# Patient Record
Sex: Male | Born: 1935
Health system: Southern US, Community
[De-identification: ages and names within clinical notes are randomized; demographics above are authoritative.]

## PROBLEM LIST (undated history)

## (undated) DIAGNOSIS — J45909 Unspecified asthma, uncomplicated: Secondary | ICD-10-CM

## (undated) DIAGNOSIS — J189 Pneumonia, unspecified organism: Secondary | ICD-10-CM

## (undated) DIAGNOSIS — M1990 Unspecified osteoarthritis, unspecified site: Secondary | ICD-10-CM

## (undated) DIAGNOSIS — I2510 Atherosclerotic heart disease of native coronary artery without angina pectoris: Secondary | ICD-10-CM

## (undated) DIAGNOSIS — N189 Chronic kidney disease, unspecified: Secondary | ICD-10-CM

## (undated) DIAGNOSIS — H548 Legal blindness, as defined in USA: Secondary | ICD-10-CM

## (undated) DIAGNOSIS — C801 Malignant (primary) neoplasm, unspecified: Secondary | ICD-10-CM

## (undated) DIAGNOSIS — R0602 Shortness of breath: Secondary | ICD-10-CM

## (undated) DIAGNOSIS — I10 Essential (primary) hypertension: Secondary | ICD-10-CM

## (undated) DIAGNOSIS — I219 Acute myocardial infarction, unspecified: Secondary | ICD-10-CM

## (undated) HISTORY — PX: HERNIA REPAIR: SHX51

## (undated) HISTORY — PX: EYE SURGERY: SHX253

## (undated) HISTORY — PX: TONSILLECTOMY: SUR1361

## (undated) HISTORY — PX: CORONARY ANGIOPLASTY: SHX604

## (undated) HISTORY — PX: KNEE ARTHROSCOPY: SUR90

---

## 2005-05-12 ENCOUNTER — Ambulatory Visit: Payer: Self-pay | Admitting: Unknown Physician Specialty

## 2005-05-12 ENCOUNTER — Other Ambulatory Visit: Payer: Self-pay

## 2005-05-17 ENCOUNTER — Ambulatory Visit: Payer: Self-pay | Admitting: General Surgery

## 2005-05-19 ENCOUNTER — Ambulatory Visit: Payer: Self-pay | Admitting: Unknown Physician Specialty

## 2005-06-05 ENCOUNTER — Emergency Department: Payer: Self-pay | Admitting: Emergency Medicine

## 2005-06-06 ENCOUNTER — Inpatient Hospital Stay: Payer: Self-pay | Admitting: Unknown Physician Specialty

## 2005-06-17 ENCOUNTER — Ambulatory Visit: Payer: Self-pay | Admitting: Unknown Physician Specialty

## 2006-08-29 ENCOUNTER — Ambulatory Visit: Payer: Self-pay | Admitting: Cardiovascular Disease

## 2008-06-26 ENCOUNTER — Ambulatory Visit: Payer: Self-pay | Admitting: Cardiovascular Disease

## 2008-12-11 ENCOUNTER — Ambulatory Visit: Payer: Self-pay | Admitting: Cardiovascular Disease

## 2010-04-14 ENCOUNTER — Ambulatory Visit: Payer: Self-pay | Admitting: Gastroenterology

## 2010-05-24 ENCOUNTER — Ambulatory Visit: Payer: Self-pay | Admitting: Gastroenterology

## 2010-05-26 LAB — PATHOLOGY REPORT

## 2010-06-16 ENCOUNTER — Ambulatory Visit: Payer: Self-pay | Admitting: Gastroenterology

## 2010-07-01 DIAGNOSIS — C4492 Squamous cell carcinoma of skin, unspecified: Secondary | ICD-10-CM

## 2010-07-01 HISTORY — DX: Squamous cell carcinoma of skin, unspecified: C44.92

## 2010-07-30 ENCOUNTER — Other Ambulatory Visit: Payer: Self-pay | Admitting: Gastroenterology

## 2011-08-06 ENCOUNTER — Other Ambulatory Visit: Payer: Self-pay | Admitting: Orthopedic Surgery

## 2011-11-07 ENCOUNTER — Inpatient Hospital Stay: Admit: 2011-11-07 | Payer: Self-pay | Admitting: Orthopedic Surgery

## 2011-11-07 SURGERY — ARTHROPLASTY, KNEE, TOTAL
Anesthesia: Choice | Site: Knee | Laterality: Left

## 2011-11-13 ENCOUNTER — Other Ambulatory Visit: Payer: Self-pay | Admitting: Orthopedic Surgery

## 2011-11-13 MED ORDER — DEXAMETHASONE SODIUM PHOSPHATE 10 MG/ML IJ SOLN: 10.0000 mg | Freq: Once | INTRAMUSCULAR | Status: DC

## 2011-11-13 MED ORDER — BUPIVACAINE 0.25 % ON-Q PUMP SINGLE CATH 300ML: 300.0000 mL | INJECTION | Status: DC

## 2012-02-15 ENCOUNTER — Encounter (HOSPITAL_COMMUNITY): Payer: Self-pay | Admitting: Pharmacy Technician

## 2012-02-20 NOTE — Patient Instructions (Signed)
20 Paul Gaines  02/20/2012   Your procedure is scheduled on:  02/27/12 1115am-1220pm  Report to [facility] at 0845 AM.  Call this number if you have problems the morning of surgery: [phone]   Remember:   Do not eat food:After Midnight.  May have clear liquids:until Midnight .  Marland Kitchen  Take these medicines the morning of surgery with A SIP OF WATER:    Do not wear jewelry  Do not wear lotions, powders, or perfumes. .   Men may shave face and neck.  Do not bring valuables to the hospital.  Contacts, dentures or bridgework may not be worn into surgery.  Leave suitcase in the car. After surgery it may be brought to your room.  For patients admitted to the hospital, checkout time is 11:00 AM the day of discharge.    Special Instructions: CHG Shower Use Special Wash: 1/2 bottle night before surgery and 1/2 bottle morning of surgery. shower chin to toes with CHG.  Wash face and private parts with regular soap.    Please read over the following fact sheets that you were given: MRSA Information, coughing and deep breathing exercises, leg exercises, Blood Transfusion Fact Sheet , Incentive spirometry fact Sheet

## 2012-02-21 ENCOUNTER — Encounter (HOSPITAL_COMMUNITY)
Admission: RE | Admit: 2012-02-21 | Discharge: 2012-02-21 | Disposition: A | Discharge status: Home / Self Care | Payer: Medicare Other | Source: Ambulatory Visit | Attending: Orthopedic Surgery | Admitting: Orthopedic Surgery

## 2012-02-21 ENCOUNTER — Ambulatory Visit (HOSPITAL_COMMUNITY)
Admission: RE | Admit: 2012-02-21 | Discharge: 2012-02-21 | Disposition: A | Discharge status: Home / Self Care | Payer: Medicare Other | Source: Ambulatory Visit | Attending: Orthopedic Surgery | Admitting: Orthopedic Surgery

## 2012-02-21 ENCOUNTER — Encounter (HOSPITAL_COMMUNITY): Payer: Self-pay

## 2012-02-21 DIAGNOSIS — Z01818 Encounter for other preprocedural examination: Secondary | ICD-10-CM | POA: Insufficient documentation

## 2012-02-21 DIAGNOSIS — Z01812 Encounter for preprocedural laboratory examination: Secondary | ICD-10-CM | POA: Insufficient documentation

## 2012-02-21 HISTORY — DX: Malignant (primary) neoplasm, unspecified: C80.1

## 2012-02-21 HISTORY — DX: Unspecified osteoarthritis, unspecified site: M19.90

## 2012-02-21 HISTORY — DX: Unspecified asthma, uncomplicated: J45.909

## 2012-02-21 HISTORY — DX: Atherosclerotic heart disease of native coronary artery without angina pectoris: I25.10

## 2012-02-21 HISTORY — DX: Shortness of breath: R06.02

## 2012-02-21 HISTORY — DX: Essential (primary) hypertension: I10

## 2012-02-21 HISTORY — DX: Pneumonia, unspecified organism: J18.9

## 2012-02-21 HISTORY — DX: Chronic kidney disease, unspecified: N18.9

## 2012-02-21 HISTORY — DX: Acute myocardial infarction, unspecified: I21.9

## 2012-02-21 LAB — COMPREHENSIVE METABOLIC PANEL
Albumin: 4.2 g/dL (ref 3.5–5.2)
BUN: 21 mg/dL (ref 6–23)
Calcium: 9.8 mg/dL (ref 8.4–10.5)
GFR calc Af Amer: 68 mL/min — ABNORMAL LOW (ref >90)
Glucose, Bld: 107 mg/dL — ABNORMAL HIGH (ref 70–99)
Sodium: 138 mEq/L (ref 135–145)
Total Protein: 7.5 g/dL (ref 6.0–8.3)

## 2012-02-21 LAB — CBC
Hemoglobin: 14.6 g/dL (ref 13.0–17.0)
MCH: 30.6 pg (ref 26.0–34.0)
MCHC: 33.9 g/dL (ref 30.0–36.0)
RDW: 14.1 % (ref 11.5–15.5)

## 2012-02-21 LAB — URINALYSIS, ROUTINE W REFLEX MICROSCOPIC
Leukocytes, UA: NEGATIVE
Nitrite: NEGATIVE
Protein, ur: NEGATIVE mg/dL
Urobilinogen, UA: 0.2 mg/dL (ref 0.0–1.0)

## 2012-02-21 LAB — SURGICAL PCR SCREEN: MRSA, PCR: NEGATIVE

## 2012-02-21 LAB — DIFFERENTIAL: Neutrophils Relative %: 58 % (ref 43–77)

## 2012-02-21 LAB — PROTIME-INR: Prothrombin Time: 14 seconds (ref 11.6–15.2)

## 2012-02-21 NOTE — Progress Notes (Signed)
Patient reports gum issue.  Nurse instructed patient to contact dentist and office of Dr Despina Hick prior to surgery.  Patient voiced understanding.

## 2012-02-21 NOTE — Progress Notes (Signed)
Your patient has screened at an elevated risk for Obstructive Sleep Apnea using the STOP-Bang Tool during a pre-surgical visit. A score of 4 or greater is an elevated risk.  

## 2012-02-21 NOTE — Progress Notes (Deleted)
The note regarding the score on the STOP - Bang Tool was sent to you in error.  I apologize.

## 2012-02-21 NOTE — Progress Notes (Signed)
02/21/12 1034  OBSTRUCTIVE SLEEP APNEA  Have you ever been diagnosed with sleep apnea through a sleep study? No  Do you snore loudly (loud enough to be heard through closed doors)?  0  Do you often feel tired, fatigued, or sleepy during the daytime? 1  Has anyone observed you stop breathing during your sleep? 0  Do you have, or are you being treated for high blood pressure? 1  BMI more than 35 kg/m2? 0  Age over 76 years old? 1  Neck circumference greater than 40 cm/18 inches? 0  Gender: 1  Obstructive Sleep Apnea Score 4   Score 4 or greater  Updated health history

## 2012-02-21 NOTE — Progress Notes (Signed)
Clearance on chart- Dr Evalee Mutton Clearance on chart- Dr Welton Flakes 12/21/10 EKG on chart  05/26/11 EKG on chart- baseline of stress test  05/26/11 STRESS TEST on chart  ECHO 10/27/11 on chart  01/24/12 Office visit with Dr Evalee Mutton on chart

## 2012-02-27 ENCOUNTER — Encounter (HOSPITAL_COMMUNITY): Admission: RE | Payer: Self-pay | Source: Ambulatory Visit

## 2012-02-27 ENCOUNTER — Inpatient Hospital Stay (HOSPITAL_COMMUNITY): Admission: RE | Admit: 2012-02-27 | Payer: Medicare Other | Source: Ambulatory Visit | Admitting: Orthopedic Surgery

## 2012-02-27 SURGERY — ARTHROPLASTY, KNEE, TOTAL
Anesthesia: Choice | Site: Knee | Laterality: Left

## 2012-03-13 ENCOUNTER — Other Ambulatory Visit: Payer: Self-pay | Admitting: Orthopedic Surgery

## 2012-03-13 MED ORDER — BUPIVACAINE 0.25 % ON-Q PUMP SINGLE CATH 300ML: 300.0000 mL | INJECTION | Status: DC

## 2012-03-13 MED ORDER — DEXAMETHASONE SODIUM PHOSPHATE 10 MG/ML IJ SOLN: 10.0000 mg | Freq: Once | INTRAMUSCULAR | Status: DC

## 2012-03-13 NOTE — Progress Notes (Signed)
Preoperative surgical orders have been place into the [facility] hospital system for Crist Infante on 03/13/2012, 4:52 PM  by Patrica Duel for surgery on 03/26/2012.  Preop Total Knee orders including Bupivacaine On-Q pump, IV Tylenol, and IV Decadron as long as there are no contraindications to the above medications. Avel Peace, PA-C

## 2012-03-16 ENCOUNTER — Encounter (HOSPITAL_COMMUNITY): Payer: Self-pay | Admitting: Pharmacy Technician

## 2012-03-21 ENCOUNTER — Other Ambulatory Visit: Payer: Self-pay | Admitting: Orthopedic Surgery

## 2012-03-21 ENCOUNTER — Encounter (HOSPITAL_COMMUNITY): Payer: Self-pay

## 2012-03-21 ENCOUNTER — Encounter (HOSPITAL_COMMUNITY)
Admission: RE | Admit: 2012-03-21 | Discharge: 2012-03-21 | Disposition: A | Discharge status: Home / Self Care | Payer: Medicare Other | Source: Ambulatory Visit | Attending: Orthopedic Surgery | Admitting: Orthopedic Surgery

## 2012-03-21 HISTORY — DX: Legal blindness, as defined in USA: H54.8

## 2012-03-21 LAB — URINALYSIS, ROUTINE W REFLEX MICROSCOPIC
Glucose, UA: NEGATIVE mg/dL
Ketones, ur: NEGATIVE mg/dL
Leukocytes, UA: NEGATIVE
Nitrite: NEGATIVE
Specific Gravity, Urine: 1.027 (ref 1.005–1.030)
pH: 6 (ref 5.0–8.0)

## 2012-03-21 LAB — CBC
Platelets: 165 10*3/uL (ref 150–400)
RBC: 4.36 MIL/uL (ref 4.22–5.81)
RDW: 14.2 % (ref 11.5–15.5)
WBC: 7.7 10*3/uL (ref 4.0–10.5)

## 2012-03-21 LAB — COMPREHENSIVE METABOLIC PANEL
ALT: 11 U/L (ref 0–53)
AST: 13 U/L (ref 0–37)
Albumin: 4.1 g/dL (ref 3.5–5.2)
CO2: 28 mEq/L (ref 19–32)
Calcium: 9.3 mg/dL (ref 8.4–10.5)
Chloride: 106 mEq/L (ref 96–112)
Creatinine, Ser: 1.19 mg/dL (ref 0.50–1.35)
GFR calc non Af Amer: 57 mL/min — ABNORMAL LOW (ref >90)
Sodium: 141 mEq/L (ref 135–145)
Total Bilirubin: 0.4 mg/dL (ref 0.3–1.2)

## 2012-03-21 LAB — PROTIME-INR: INR: 1.15 (ref 0.00–1.49)

## 2012-03-21 LAB — SURGICAL PCR SCREEN
MRSA, PCR: NEGATIVE
Staphylococcus aureus: NEGATIVE

## 2012-03-21 NOTE — Progress Notes (Signed)
11/2008 Cath note on chart  05/26/11 Stress test on chart  10/25/11 Last office visit note on chart with cardiology  01/24/12 Last office visit note in chart  4/11 /13 ECHO in chart

## 2012-03-21 NOTE — H&P (Signed)
Paul Gaines  DOB: 02-28-1933 Married / Language: English / Race: Refused to Report/Unreported Male  Date of Admission:  03/26/2012  Chief Complaint:  Left Knee Pain  History of Present Illness The patient is a 78 year old male who comes in for a preoperative History and Physical. The patient is scheduled for a left total knee arthroplasty to be performed by Dr. Gus Rankin. Aluisio, MD at [facility] on 03/26/2012. The patient is a 78 year old male who presents for follow up of their knee. The patient is being followed for their left knee pain and osteoarthritis. Symptoms reported today include: pain (with extended walking), pain at night and swelling (into lower leg and ankle). The following medication has been used for pain control: antiinflammatory medication (Naproxen). The patient indicates that they have questions or concerns today regarding whether or not to repeat Synvisc, not much relief with last injections. He's got advanced end stage arthritis of the left knee with progressively worsening pain and dysfunction. Injections have not helped. At this point he says he needs a more permanent solution. I would recommend knee replacement surgery. They have been treated conservatively in the past for the above stated problem and despite conservative measures, they continue to have progressive pain and severe functional limitations and dysfunction. They have failed non-operative management including home exercise, medications, and injections. It is felt that they would benefit from undergoing total joint replacement. Risks and benefits of the procedure have been discussed with the patient and they elect to proceed with surgery. There are no active contraindications to surgery such as ongoing infection or rapidly progressive neurological disease.   Allergies CELEBREX. 04/20/2006 Thinks it may have caused his heart attack VIOXX. 04/20/2006 Chest pain.   Past Medical History Coronary  artery disease High blood pressure Hypercholesterolemia Skin Cancer Hypercholesterolemia Myocardial infarction. Date: 29. Measles Stent. 2000 Coronary Artery Disease/Heart Disease Macular Degeneration Impaired Vision Hypertension COPD   Family History Mother. Dementia Father. Cancer.   Social History Alcohol use. Occasional alcohol use. 1 Beer per week current drinker; drinks wine; only occasionally per week Children. 8 5 or more Current work status. retired Financial planner (Currently). no Drug/Alcohol Rehab (Previously). no Exercise. Exercises weekly; does gym / weights Illicit drug use. no Living situation. Lives with spouse. live with spouse Marital status. married Number of flights of stairs before winded. greater than 5 Pain Contract. no Tobacco / smoke exposure. no Tobacco use. Former smoker. quit over 30 years ago former smoker Post-Surgical Plans. Plan is for home. Advance Directives. Living Will   Medication History Multiple Vitamin (1 Oral) Active. Aspirin (81MG  Tablet, 1 Oral) Active. Timolol Maleate (0.5% Solution, Ophthalmic) Active. PrednisoLONE Acetate (1% Suspension, Ophthalmic) Active. Pantoprazole Sodium (40MG  Tablet DR, Oral) Active. Pravastatin Sodium (40MG  Tablet, Oral) Active. Carvedilol (25MG  Tablet, Oral) Active. Doxazosin Mesylate (4MG  Tablet, Oral) Active. AmLODIPine Besylate (5MG  Tablet, Oral) Active. Probiotic ( Oral) Specific dose unknown - Active. Naproxen Sodium (500MG  Tablet ER, Oral) Active. (prn) Nitrostat (0.4MG  Tab Sublingual, Sublingual) Active. (prn)   Past Surgical History Colon Polyp Removal - Colonoscopy Heart Stents Tonsillectomy Total Knee Replacement. left Cardiac Pacemaker Insertion Hernia Repair Cardiac Cath with Stent. Date: 2000. Cardiac Cath with Ballon Angioplasty Root Canal   Review of Systems General:Not Present- Chills, Fever, Night Sweats, Appetite Loss,  Fatigue, Feeling sick, Weight Gain and Weight Loss. HEENT:Not Present- Sensitivity to light, Hearing problems, Nose Bleed and Ringing in the Ears. Neck:Not Present- Swollen Glands and Neck Mass. Respiratory:Not Present- Snoring, Chronic  Cough, Bloody sputum and Dyspnea. Cardiovascular:Not Present- Shortness of Breath, Chest Pain, Swelling of Extremities, Leg Cramps and Palpitations. Gastrointestinal:Not Present- Bloody Stool, Heartburn, Abdominal Pain, Vomiting, Nausea and Incontinence of Stool. Male Genitourinary:Not Present- Blood in Urine, Frequency, Incontinence and Nocturia. Musculoskeletal:Present- Joint Stiffness. Not Present- Muscle Weakness, Muscle Pain, Joint Swelling, Joint Pain and Back Pain. Neurological:Not Present- Tingling, Numbness, Burning, Tremor, Headaches and Dizziness. Endocrine:Not Present- Cold Intolerance, Heat Intolerance, Excessive hunger and Excessive Thirst. Hematology:Not Present- Abnormal Bleeding, Anemia, Blood Clots and Easy Bruising.   Vitals Weight: 207 lb Height: 71 in Body Surface Area: 2.17 m Body Mass Index: 28.87 kg/m Pulse: 56 (Regular) Resp.: 14 (Unlabored) BP: 110/60 (Sitting, Right Arm, Standard)    Physical Exam The physical exam findings are as follows:  Note: Patient is a 78 year old male with continued knee pain.   General Mental Status - Alert, cooperative and good historian. General Appearance- pleasant. Not in acute distress. Orientation- Oriented X3. Build & Nutrition- Well nourished and Well developed.   Head and Neck Head- normocephalic, atraumatic . Neck Global Assessment- supple. no bruit auscultated on the right and no bruit auscultated on the left.   Eye Pupil- Bilateral- Regular and Round. Motion- Bilateral- EOMI.   Note: wears glasses  Chest and Lung Exam Auscultation: Breath sounds:- clear at anterior chest wall and - clear at posterior chest wall. Adventitious  sounds:- No Adventitious sounds.   Cardiovascular Auscultation:Rhythm- Regular rate and rhythm. Heart Sounds- S1 WNL and S2 WNL. Murmurs & Other Heart Sounds:Auscultation of the heart reveals - No Murmurs.   Abdomen Palpation/Percussion:Tenderness- Abdomen is non-tender to palpation. Rigidity (guarding)- Abdomen is soft. Auscultation:Auscultation of the abdomen reveals - Bowel sounds normal.   Male Genitourinary Not done, not pertinent to present illness  Musculoskeletal On exam, he's a well-developed male, alert and oriented, in no apparent distress. His left knee shows no effusion. There is a varus deformity. ROM is 5-120. There is marked crepitus on ROM. He is tender medial greater than lateral with no instability. Right knee exam is unremarkable. He hasn't had x-rays in almost 2 1/2 years, so we obtained x-rays today.  RADIOGRAPHS: He has severe bone on bone arthritis, all 3 compartments, with subchondral sclerosis and osteophyte formation. Arthritis is worse in the medial compartment but present in all 3.  Assessment & Plan Osteoarthritis, knee (715.96) Impression: Left Knee  Patient is for a Left Total Knee Replacement by Dr. Lequita Halt.  ECHO EF 55-75% per exam on 10/27/11.  Plan is to go to rehab for a short stay after the surgery. He is looking into [facility]	[place] in [place], [place].  Signed electronically by Roberts Gaudy, PA-C

## 2012-03-21 NOTE — Patient Instructions (Addendum)
20 Beacher Every  03/21/2012   Your procedure is scheduled on:  03/26/12 0955am-1100am  Report to [facility] at 0730 AM.  Call this number if you have problems the morning of surgery: [phone]   Remember:   Do not eat food:After Midnight.  May have clear liquids:until Midnight .    Take these medicines the morning of surgery with A SIP OF WATER:    Do not wear jewelry, make-up or nail polish.  Do not wear lotions, powders, or perfumes.   . Men may shave face and neck.  Do not bring valuables to the hospital.  Contacts, dentures or bridgework may not be worn into surgery.  Leave suitcase in the car. After surgery it may be brought to your room.  For patients admitted to the hospital, checkout time is 11:00 AM the day of discharge.      Special Instructions: CHG Shower Use Special Wash: 1/2 bottle night before surgery and 1/2 bottle morning of surgery.   Please read over the following fact sheets that you were given: MRSA Information, Incentive Spirometry Fact sheet, Blood Transfusion FAct Sheet, coughing and deep breathing exercises, leg exercises

## 2012-03-21 NOTE — Progress Notes (Signed)
03/21/12 1428  OBSTRUCTIVE SLEEP APNEA  Have you ever been diagnosed with sleep apnea through a sleep study? No  Do you snore loudly (loud enough to be heard through closed doors)?  0  Do you often feel tired, fatigued, or sleepy during the daytime? 1  Has anyone observed you stop breathing during your sleep? 0  Do you have, or are you being treated for high blood pressure? 1  BMI more than 35 kg/m2? 0  Age over 76 years old? 1  Neck circumference greater than 40 cm/18 inches? 0  Gender: 1  Obstructive Sleep Apnea Score 4   Score 4 or greater  Updated health history

## 2012-03-22 NOTE — Progress Notes (Signed)
Patient had recent 2 root canals in 02/2012.

## 2012-03-25 ENCOUNTER — Other Ambulatory Visit: Payer: Self-pay | Admitting: Orthopedic Surgery

## 2012-03-25 NOTE — H&P (Signed)
Paul Gaines  DOB: 08/13/1932 Married / Language: English / Race: Refused to Report/Unreported Male  Date of Admission:  03/26/2012  Chief Complaint:  Left Knee Pain  History of Present Illness The patient is a 79 year old male who comes in for a preoperative History and Physical. The patient is scheduled for a left total knee arthroplasty to be performed by Dr. Frank V. Aluisio, MD at [facility] on 03/26/2012. The patient is a 78 year old male who presents for follow up of their knee. The patient is being followed for their left knee pain and osteoarthritis. Symptoms reported today include: pain (with extended walking), pain at night and swelling (into lower leg and ankle). The following medication has been used for pain control: antiinflammatory medication (Naproxen). The patient indicates that they have questions or concerns today regarding whether or not to repeat Synvisc, not much relief with last injections. He's got advanced end stage arthritis of the left knee with progressively worsening pain and dysfunction. Injections have not helped. At this point he says he needs a more permanent solution. I would recommend knee replacement surgery. They have been treated conservatively in the past for the above stated problem and despite conservative measures, they continue to have progressive pain and severe functional limitations and dysfunction. They have failed non-operative management including home exercise, medications, and injections. It is felt that they would benefit from undergoing total joint replacement. Risks and benefits of the procedure have been discussed with the patient and they elect to proceed with surgery. There are no active contraindications to surgery such as ongoing infection or rapidly progressive neurological disease.  Problem List/Past Medical Osteoarthritis, knee (715.96) Coronary artery disease High blood pressure Hypercholesterolemia Skin  Cancer Hypercholesterolemia Myocardial infarction. Date: 1988. Measles Stent. 2000 Coronary Artery Disease/Heart Disease Macular Degeneration Impaired Vision Hypertension COPD   Allergies CELEBREX. 04/20/2006 Thinks it may have caused his heart attack VIOXX. 04/20/2006 Chest pain.   Family History Mother. Dementia Father. Cancer.   Social History Alcohol use. Occasional alcohol use. 1 Beer per week current drinker; drinks wine; only occasionally per week Children. 8 5 or more Current work status. retired Drug/Alcohol Rehab (Currently). no Drug/Alcohol Rehab (Previously). no Exercise. Exercises weekly; does gym / weights Illicit drug use. no Living situation. Lives with spouse. live with spouse Marital status. married Number of flights of stairs before winded. greater than 5 Pain Contract. no Tobacco / smoke exposure. no Tobacco use. Former smoker. quit over 30 years ago former smoker Post-Surgical Plans. Plan is for home. Advance Directives. Living Will   Medication History Multiple Vitamin (1 Oral) Active. Aspirin (81MG Tablet, 1 Oral) Active. Timolol Maleate (0.5% Solution, Ophthalmic) Active. PrednisoLONE Acetate (1% Suspension, Ophthalmic) Active. Pantoprazole Sodium (40MG Tablet DR, Oral) Active. Pravastatin Sodium (40MG Tablet, Oral) Active. Carvedilol (25MG Tablet, Oral) Active. Doxazosin Mesylate (4MG Tablet, Oral) Active. AmLODIPine Besylate (5MG Tablet, Oral) Active. Probiotic ( Oral) Specific dose unknown - Active. Naproxen Sodium (500MG Tablet ER, Oral) Active. (prn) Nitrostat (0.4MG Tab Sublingual, Sublingual) Active. (prn)   Past Surgical History Colon Polyp Removal - Colonoscopy Heart Stents Tonsillectomy Total Knee Replacement. left Cardiac Pacemaker Insertion Hernia Repair Cardiac Cath with Stent. Date: 2000. Cardiac Cath with [place]on Angioplasty Root Canal   Review of Systems General:Not Present-  Chills, Fever, Night Sweats, Appetite Loss, Fatigue, Feeling sick, Weight Gain and Weight Loss. HEENT:Not Present- Sensitivity to light, Hearing problems, Nose Bleed and Ringing in the Ears. Neck:Not Present- Swollen Glands and Neck Mass. Respiratory:Not Present-   Snoring, Chronic Cough, Bloody sputum and Dyspnea. Cardiovascular:Not Present- Shortness of Breath, Chest Pain, Swelling of Extremities, Leg Cramps and Palpitations. Gastrointestinal:Not Present- Bloody Stool, Heartburn, Abdominal Pain, Vomiting, Nausea and Incontinence of Stool. Male Genitourinary:Not Present- Blood in Urine, Frequency, Incontinence and Nocturia. Musculoskeletal:Present- Joint Stiffness. Not Present- Muscle Weakness, Muscle Pain, Joint Swelling, Joint Pain and Back Pain. Neurological:Not Present- Tingling, Numbness, Burning, Tremor, Headaches and Dizziness. Endocrine:Not Present- Cold Intolerance, Heat Intolerance, Excessive hunger and Excessive Thirst. Hematology:Not Present- Abnormal Bleeding, Anemia, Blood Clots and Easy Bruising.   Vitals Weight: 207 lb Height: 71 in Body Surface Area: 2.17 m Body Mass Index: 28.87 kg/m Pulse: 56 (Regular) Resp.: 14 (Unlabored) BP: 110/60 (Sitting, Right Arm, Standard)    Physical Exam The physical exam findings are as follows:  Note: Patient is a 79 year old male with continued knee pain.   General Mental Status - Alert, cooperative and good historian. General Appearance- pleasant. Not in acute distress. Orientation- Oriented X3. Build & Nutrition- Well nourished and Well developed.   Head and Neck Head- normocephalic, atraumatic . Neck Global Assessment- supple. no bruit auscultated on the right and no bruit auscultated on the left.   Eye Pupil- Bilateral- Regular and Round. Motion- Bilateral- EOMI. wears glasses  Chest and Lung Exam Auscultation: Breath sounds:- clear at anterior chest wall and - clear at  posterior chest wall. Adventitious sounds:- No Adventitious sounds.   Cardiovascular Auscultation:Rhythm- Regular rate and rhythm. Heart Sounds- S1 WNL and S2 WNL. Murmurs & Other Heart Sounds:Auscultation of the heart reveals - No Murmurs.   Abdomen Palpation/Percussion:Tenderness- Abdomen is non-tender to palpation. Rigidity (guarding)- Abdomen is soft. Auscultation:Auscultation of the abdomen reveals - Bowel sounds normal.   Male Genitourinary Not done, not pertinent to present illness  Musculoskeletal  On exam, he's a well-developed male, alert and oriented, in no apparent distress. His left knee shows no effusion. There is a varus deformity. ROM is 5-120. There is marked crepitus on ROM. He is tender medial greater than lateral with no instability. Right knee exam is unremarkable. He hasn't had x-rays in almost 2 1/2 years, so we obtained x-rays today.  RADIOGRAPHS: He has severe bone on bone arthritis, all 3 compartments, with subchondral sclerosis and osteophyte formation. Arthritis is worse in the medial compartment but present in all 3.  Assessment & Plan Osteoarthritis, knee (715.96) Impression: Left Knee  Note: Patient is for a Left Total Knee Replacement by Dr. Aluisio.  PCP - Dr. Tejan-sie Cards - Dr. Khan  ECHO EF 55-75% per exam on 10/27/11.  Plan is to go to rehab for a short stay after the surgery. He is looking into [facility] in [place], [place].  Signed electronically by DREW L Teneshia Hedeen, PA-C 

## 2012-03-26 ENCOUNTER — Encounter (HOSPITAL_COMMUNITY): Payer: Self-pay | Admitting: *Deleted

## 2012-03-26 ENCOUNTER — Encounter (HOSPITAL_COMMUNITY): Payer: Self-pay | Admitting: Anesthesiology

## 2012-03-26 ENCOUNTER — Inpatient Hospital Stay (HOSPITAL_COMMUNITY): Payer: Medicare Other | Admitting: Anesthesiology

## 2012-03-26 ENCOUNTER — Encounter (HOSPITAL_COMMUNITY)
Admission: RE | Disposition: A | Discharge status: Skilled Nursing Facility | Payer: Self-pay | Source: Ambulatory Visit | Attending: Orthopedic Surgery

## 2012-03-26 ENCOUNTER — Inpatient Hospital Stay (HOSPITAL_COMMUNITY)
Admission: RE | Admit: 2012-03-26 | Discharge: 2012-03-30 | DRG: 470 | Disposition: A | Discharge status: Skilled Nursing Facility | Payer: Medicare Other | Source: Ambulatory Visit | Attending: Orthopedic Surgery | Admitting: Orthopedic Surgery

## 2012-03-26 DIAGNOSIS — I252 Old myocardial infarction: Secondary | ICD-10-CM

## 2012-03-26 DIAGNOSIS — I2510 Atherosclerotic heart disease of native coronary artery without angina pectoris: Secondary | ICD-10-CM | POA: Diagnosis present

## 2012-03-26 DIAGNOSIS — M1710 Unilateral primary osteoarthritis, unspecified knee: Principal | ICD-10-CM | POA: Diagnosis present

## 2012-03-26 DIAGNOSIS — D62 Acute posthemorrhagic anemia: Secondary | ICD-10-CM | POA: Diagnosis not present

## 2012-03-26 DIAGNOSIS — M179 Osteoarthritis of knee, unspecified: Secondary | ICD-10-CM | POA: Diagnosis present

## 2012-03-26 DIAGNOSIS — E871 Hypo-osmolality and hyponatremia: Secondary | ICD-10-CM | POA: Diagnosis not present

## 2012-03-26 DIAGNOSIS — Z96659 Presence of unspecified artificial knee joint: Secondary | ICD-10-CM

## 2012-03-26 DIAGNOSIS — Z01812 Encounter for preprocedural laboratory examination: Secondary | ICD-10-CM

## 2012-03-26 DIAGNOSIS — Z9861 Coronary angioplasty status: Secondary | ICD-10-CM

## 2012-03-26 HISTORY — PX: TOTAL KNEE ARTHROPLASTY: SHX125

## 2012-03-26 SURGERY — ARTHROPLASTY, KNEE, TOTAL
Anesthesia: Spinal | Site: Knee | Laterality: Left | Wound class: Clean

## 2012-03-26 MED ORDER — DARIFENACIN HYDROBROMIDE ER 7.5 MG PO TB24
7.5000 mg | ORAL_TABLET | Freq: Every day | ORAL | Status: DC
  Administered 2012-03-26 – 2012-03-29 (×4): 7.5 mg via ORAL
  Filled 2012-03-26 (×5): qty 1

## 2012-03-26 MED ORDER — ACETAMINOPHEN 325 MG PO TABS: 650.0000 mg | ORAL_TABLET | Freq: Four times a day (QID) | ORAL | Status: DC | PRN

## 2012-03-26 MED ORDER — ACETAMINOPHEN 650 MG RE SUPP: 650.0000 mg | Freq: Four times a day (QID) | RECTAL | Status: DC | PRN

## 2012-03-26 MED ORDER — RIVAROXABAN 10 MG PO TABS
10.0000 mg | ORAL_TABLET | Freq: Every day | ORAL | Status: DC
  Administered 2012-03-27 – 2012-03-30 (×4): 10 mg via ORAL
  Filled 2012-03-26 (×5): qty 1

## 2012-03-26 MED ORDER — METHOCARBAMOL 500 MG PO TABS
500.0000 mg | ORAL_TABLET | Freq: Four times a day (QID) | ORAL | Status: DC | PRN
  Administered 2012-03-27 – 2012-03-29 (×5): 500 mg via ORAL
  Filled 2012-03-26 (×6): qty 1

## 2012-03-26 MED ORDER — PHENOL 1.4 % MT LIQD
1.0000 | OROMUCOSAL | Status: DC | PRN
  Filled 2012-03-26: qty 177

## 2012-03-26 MED ORDER — LIDOCAINE HCL (CARDIAC) 20 MG/ML IV SOLN
INTRAVENOUS | Status: DC | PRN
  Administered 2012-03-26: 100 mg via INTRAVENOUS

## 2012-03-26 MED ORDER — CEFAZOLIN SODIUM 1-5 GM-% IV SOLN
1.0000 g | Freq: Four times a day (QID) | INTRAVENOUS | Status: AC
  Administered 2012-03-26 (×2): 1 g via INTRAVENOUS
  Filled 2012-03-26 (×2): qty 50

## 2012-03-26 MED ORDER — LACTATED RINGERS IV SOLN
INTRAVENOUS | Status: DC | PRN
  Administered 2012-03-26 (×2): via INTRAVENOUS

## 2012-03-26 MED ORDER — GLYCOPYRROLATE 0.2 MG/ML IJ SOLN
INTRAMUSCULAR | Status: DC | PRN
  Administered 2012-03-26: 0.2 mg via INTRAVENOUS

## 2012-03-26 MED ORDER — ONDANSETRON HCL 4 MG/2ML IJ SOLN
INTRAMUSCULAR | Status: DC | PRN
  Administered 2012-03-26: 4 mg via INTRAVENOUS

## 2012-03-26 MED ORDER — DIPHENHYDRAMINE HCL 12.5 MG/5ML PO ELIX: 12.5000 mg | ORAL_SOLUTION | ORAL | Status: DC | PRN

## 2012-03-26 MED ORDER — TIMOLOL MALEATE 0.5 % OP SOLN
1.0000 [drp] | Freq: Every day | OPHTHALMIC | Status: DC
  Administered 2012-03-27 – 2012-03-30 (×4): 1 [drp] via OPHTHALMIC
  Filled 2012-03-26: qty 5

## 2012-03-26 MED ORDER — PREDNISOLONE ACETATE 1 % OP SUSP
1.0000 [drp] | Freq: Every morning | OPHTHALMIC | Status: DC
  Administered 2012-03-27 – 2012-03-30 (×4): 1 [drp] via OPHTHALMIC
  Filled 2012-03-26: qty 1

## 2012-03-26 MED ORDER — POLYETHYLENE GLYCOL 3350 17 G PO PACK: 17.0000 g | PACK | Freq: Every day | ORAL | Status: DC | PRN

## 2012-03-26 MED ORDER — DOXAZOSIN MESYLATE 4 MG PO TABS
4.0000 mg | ORAL_TABLET | Freq: Every day | ORAL | Status: DC
  Administered 2012-03-26 – 2012-03-29 (×4): 4 mg via ORAL
  Filled 2012-03-26 (×5): qty 1

## 2012-03-26 MED ORDER — HYDROMORPHONE HCL PF 1 MG/ML IJ SOLN: 0.2500 mg | INTRAMUSCULAR | Status: DC | PRN

## 2012-03-26 MED ORDER — METOCLOPRAMIDE HCL 10 MG PO TABS: 5.0000 mg | ORAL_TABLET | Freq: Three times a day (TID) | ORAL | Status: DC | PRN

## 2012-03-26 MED ORDER — PROPOFOL INFUSION 10 MG/ML OPTIME
INTRAVENOUS | Status: DC | PRN
  Administered 2012-03-26: 75 ug/kg/min via INTRAVENOUS

## 2012-03-26 MED ORDER — CHLORHEXIDINE GLUCONATE 4 % EX LIQD
60.0000 mL | Freq: Once | CUTANEOUS | Status: DC
  Filled 2012-03-26: qty 60

## 2012-03-26 MED ORDER — PROMETHAZINE HCL 25 MG/ML IJ SOLN: 6.2500 mg | INTRAMUSCULAR | Status: DC | PRN

## 2012-03-26 MED ORDER — DIPHENHYDRAMINE HCL 12.5 MG/5ML PO ELIX: 12.5000 mg | ORAL_SOLUTION | Freq: Four times a day (QID) | ORAL | Status: DC | PRN

## 2012-03-26 MED ORDER — SODIUM CHLORIDE 0.9 % IR SOLN
Status: DC | PRN
  Administered 2012-03-26: 1000 mL

## 2012-03-26 MED ORDER — DEXTROSE 5 % IV SOLN
3.0000 g | INTRAVENOUS | Status: AC
  Administered 2012-03-26: 2 g via INTRAVENOUS
  Filled 2012-03-26: qty 3000

## 2012-03-26 MED ORDER — SODIUM CHLORIDE 0.9 % IJ SOLN: 9.0000 mL | INTRAMUSCULAR | Status: DC | PRN

## 2012-03-26 MED ORDER — ONDANSETRON HCL 4 MG PO TABS
4.0000 mg | ORAL_TABLET | Freq: Four times a day (QID) | ORAL | Status: DC | PRN
  Administered 2012-03-27 – 2012-03-29 (×2): 4 mg via ORAL
  Filled 2012-03-26 (×2): qty 1

## 2012-03-26 MED ORDER — TRAMADOL HCL 50 MG PO TABS
50.0000 mg | ORAL_TABLET | Freq: Four times a day (QID) | ORAL | Status: DC | PRN
  Administered 2012-03-29 (×3): 50 mg via ORAL
  Filled 2012-03-26 (×2): qty 1
  Filled 2012-03-26: qty 2
  Filled 2012-03-26: qty 1

## 2012-03-26 MED ORDER — ACETAMINOPHEN 10 MG/ML IV SOLN
1000.0000 mg | Freq: Once | INTRAVENOUS | Status: AC
  Administered 2012-03-26: 1000 mg via INTRAVENOUS

## 2012-03-26 MED ORDER — BISACODYL 10 MG RE SUPP
10.0000 mg | Freq: Every day | RECTAL | Status: DC | PRN
  Filled 2012-03-26: qty 1

## 2012-03-26 MED ORDER — FLEET ENEMA 7-19 GM/118ML RE ENEM: 1.0000 | ENEMA | Freq: Once | RECTAL | Status: AC | PRN

## 2012-03-26 MED ORDER — BUPIVACAINE 0.25 % ON-Q PUMP SINGLE CATH 100 ML
INJECTION | Status: DC | PRN
  Administered 2012-03-26: 270 mL

## 2012-03-26 MED ORDER — OXYCODONE HCL 5 MG PO TABS
5.0000 mg | ORAL_TABLET | ORAL | Status: DC | PRN
  Administered 2012-03-27 – 2012-03-30 (×9): 10 mg via ORAL
  Filled 2012-03-26 (×8): qty 2
  Filled 2012-03-26: qty 1
  Filled 2012-03-26 (×2): qty 2

## 2012-03-26 MED ORDER — TIMOLOL HEMIHYDRATE 0.5 % OP SOLN: 1.0000 [drp] | Freq: Every morning | OPHTHALMIC | Status: DC

## 2012-03-26 MED ORDER — SODIUM CHLORIDE 0.9 % IV SOLN: INTRAVENOUS | Status: DC

## 2012-03-26 MED ORDER — ONDANSETRON HCL 4 MG/2ML IJ SOLN: 4.0000 mg | Freq: Four times a day (QID) | INTRAMUSCULAR | Status: DC | PRN

## 2012-03-26 MED ORDER — METOCLOPRAMIDE HCL 5 MG/ML IJ SOLN: 5.0000 mg | Freq: Three times a day (TID) | INTRAMUSCULAR | Status: DC | PRN

## 2012-03-26 MED ORDER — SODIUM CHLORIDE 0.9 % IV SOLN
INTRAVENOUS | Status: DC
  Administered 2012-03-27: 01:00:00 via INTRAVENOUS

## 2012-03-26 MED ORDER — LACTATED RINGERS IV SOLN: INTRAVENOUS | Status: DC

## 2012-03-26 MED ORDER — DOCUSATE SODIUM 100 MG PO CAPS
100.0000 mg | ORAL_CAPSULE | Freq: Two times a day (BID) | ORAL | Status: DC
  Administered 2012-03-26 – 2012-03-30 (×8): 100 mg via ORAL

## 2012-03-26 MED ORDER — AMLODIPINE BESYLATE 10 MG PO TABS
10.0000 mg | ORAL_TABLET | Freq: Every morning | ORAL | Status: DC
  Administered 2012-03-27 – 2012-03-30 (×4): 10 mg via ORAL
  Filled 2012-03-26 (×4): qty 1

## 2012-03-26 MED ORDER — MORPHINE SULFATE (PF) 1 MG/ML IV SOLN
INTRAVENOUS | Status: DC
  Administered 2012-03-26: 1 mg via INTRAVENOUS
  Administered 2012-03-26: 4 mg via INTRAVENOUS
  Administered 2012-03-27: 1 mg via INTRAVENOUS

## 2012-03-26 MED ORDER — DIPHENHYDRAMINE HCL 50 MG/ML IJ SOLN: 12.5000 mg | Freq: Four times a day (QID) | INTRAMUSCULAR | Status: DC | PRN

## 2012-03-26 MED ORDER — METHOCARBAMOL 100 MG/ML IJ SOLN
500.0000 mg | Freq: Four times a day (QID) | INTRAVENOUS | Status: DC | PRN
  Filled 2012-03-26: qty 5

## 2012-03-26 MED ORDER — MENTHOL 3 MG MT LOZG
1.0000 | LOZENGE | OROMUCOSAL | Status: DC | PRN
  Filled 2012-03-26: qty 9

## 2012-03-26 MED ORDER — MIDAZOLAM HCL 5 MG/5ML IJ SOLN
INTRAMUSCULAR | Status: DC | PRN
  Administered 2012-03-26 (×2): 1 mg via INTRAVENOUS

## 2012-03-26 MED ORDER — NALOXONE HCL 0.4 MG/ML IJ SOLN: 0.4000 mg | INTRAMUSCULAR | Status: DC | PRN

## 2012-03-26 MED ORDER — FENTANYL CITRATE 0.05 MG/ML IJ SOLN
INTRAMUSCULAR | Status: DC | PRN
  Administered 2012-03-26: 50 ug via INTRAVENOUS

## 2012-03-26 MED ORDER — ACETAMINOPHEN 10 MG/ML IV SOLN
1000.0000 mg | Freq: Four times a day (QID) | INTRAVENOUS | Status: AC
  Administered 2012-03-26 – 2012-03-27 (×4): 1000 mg via INTRAVENOUS
  Filled 2012-03-26 (×5): qty 100

## 2012-03-26 MED ORDER — CARVEDILOL 25 MG PO TABS
25.0000 mg | ORAL_TABLET | Freq: Two times a day (BID) | ORAL | Status: DC
  Administered 2012-03-26 – 2012-03-30 (×8): 25 mg via ORAL
  Filled 2012-03-26 (×11): qty 1

## 2012-03-26 MED ORDER — BUPIVACAINE ON-Q PAIN PUMP (FOR ORDER SET NO CHG)
INJECTION | Status: DC
  Filled 2012-03-26: qty 1

## 2012-03-26 MED ORDER — SIMVASTATIN 10 MG PO TABS
10.0000 mg | ORAL_TABLET | Freq: Every day | ORAL | Status: DC
  Administered 2012-03-26 – 2012-03-29 (×4): 10 mg via ORAL
  Filled 2012-03-26 (×5): qty 1

## 2012-03-26 MED ORDER — PSYLLIUM 95 % PO PACK
1.0000 | PACK | Freq: Every day | ORAL | Status: DC
  Administered 2012-03-27 – 2012-03-30 (×4): 1 via ORAL
  Filled 2012-03-26 (×5): qty 1

## 2012-03-26 SURGICAL SUPPLY — 53 items
BAG ZIPLOCK 12X15 (MISCELLANEOUS) ×2 IMPLANT
BANDAGE ELASTIC 6 VELCRO ST LF (GAUZE/BANDAGES/DRESSINGS) ×2 IMPLANT
BANDAGE ESMARK 6X9 LF (GAUZE/BANDAGES/DRESSINGS) ×1 IMPLANT
BLADE SAG 18X100X1.27 (BLADE) ×2 IMPLANT
BLADE SAW SGTL 11.0X1.19X90.0M (BLADE) ×2 IMPLANT
BNDG ESMARK 6X9 LF (GAUZE/BANDAGES/DRESSINGS) ×2
BOWL SMART MIX CTS (DISPOSABLE) ×2 IMPLANT
CATH KIT ON-Q SILVERSOAK 5IN (CATHETERS) ×2 IMPLANT
CEMENT HV SMART SET (Cement) ×4 IMPLANT
CLOTH BEACON ORANGE TIMEOUT ST (SAFETY) ×2 IMPLANT
CUFF TOURN SGL QUICK 34 (TOURNIQUET CUFF) ×1
CUFF TRNQT CYL 34X4X40X1 (TOURNIQUET CUFF) ×1 IMPLANT
DRAPE EXTREMITY T 121X128X90 (DRAPE) ×2 IMPLANT
DRAPE LG THREE QUARTER DISP (DRAPES) ×2 IMPLANT
DRAPE POUCH INSTRU U-SHP 10X18 (DRAPES) ×2 IMPLANT
DRAPE U-SHAPE 47X51 STRL (DRAPES) ×2 IMPLANT
DRSG ADAPTIC 3X8 NADH LF (GAUZE/BANDAGES/DRESSINGS) ×2 IMPLANT
DURAPREP 26ML APPLICATOR (WOUND CARE) ×2 IMPLANT
ELECT REM PT RETURN 9FT ADLT (ELECTROSURGICAL) ×2
ELECTRODE REM PT RTRN 9FT ADLT (ELECTROSURGICAL) ×1 IMPLANT
EVACUATOR 1/8 PVC DRAIN (DRAIN) ×2 IMPLANT
FACESHIELD LNG OPTICON STERILE (SAFETY) ×10 IMPLANT
GLOVE BIO SURGEON STRL SZ8 (GLOVE) ×2 IMPLANT
GLOVE BIOGEL PI IND STRL 7.5 (GLOVE) ×1 IMPLANT
GLOVE BIOGEL PI IND STRL 8 (GLOVE) ×2 IMPLANT
GLOVE BIOGEL PI INDICATOR 7.5 (GLOVE) ×1
GLOVE BIOGEL PI INDICATOR 8 (GLOVE) ×2
GLOVE ECLIPSE 8.0 STRL XLNG CF (GLOVE) ×2 IMPLANT
GLOVE SURG SS PI 6.5 STRL IVOR (GLOVE) ×4 IMPLANT
GOWN STRL NON-REIN LRG LVL3 (GOWN DISPOSABLE) ×4 IMPLANT
GOWN STRL REIN XL XLG (GOWN DISPOSABLE) ×4 IMPLANT
HANDPIECE INTERPULSE COAX TIP (DISPOSABLE) ×1
IMMOBILIZER KNEE 20 (SOFTGOODS) ×2
IMMOBILIZER KNEE 20 THIGH 36 (SOFTGOODS) ×1 IMPLANT
KIT BASIN OR (CUSTOM PROCEDURE TRAY) ×2 IMPLANT
MANIFOLD NEPTUNE II (INSTRUMENTS) ×2 IMPLANT
NS IRRIG 1000ML POUR BTL (IV SOLUTION) ×2 IMPLANT
PACK TOTAL JOINT (CUSTOM PROCEDURE TRAY) ×2 IMPLANT
PAD ABD 7.5X8 STRL (GAUZE/BANDAGES/DRESSINGS) ×2 IMPLANT
PADDING CAST COTTON 6X4 STRL (CAST SUPPLIES) ×2 IMPLANT
POSITIONER SURGICAL ARM (MISCELLANEOUS) ×2 IMPLANT
SET HNDPC FAN SPRY TIP SCT (DISPOSABLE) ×1 IMPLANT
SPONGE GAUZE 4X4 12PLY (GAUZE/BANDAGES/DRESSINGS) ×2 IMPLANT
STRIP CLOSURE SKIN 1/2X4 (GAUZE/BANDAGES/DRESSINGS) ×2 IMPLANT
SUCTION FRAZIER 12FR DISP (SUCTIONS) ×2 IMPLANT
SUT MNCRL AB 4-0 PS2 18 (SUTURE) ×2 IMPLANT
SUT VIC AB 2-0 CT1 27 (SUTURE) ×3
SUT VIC AB 2-0 CT1 TAPERPNT 27 (SUTURE) ×3 IMPLANT
SUT VLOC 180 0 24IN GS25 (SUTURE) ×2 IMPLANT
TOWEL OR 17X26 10 PK STRL BLUE (TOWEL DISPOSABLE) ×4 IMPLANT
TRAY FOLEY CATH 14FRSI W/METER (CATHETERS) ×2 IMPLANT
WATER STERILE IRR 1500ML POUR (IV SOLUTION) ×2 IMPLANT
WRAP KNEE MAXI GEL POST OP (GAUZE/BANDAGES/DRESSINGS) ×4 IMPLANT

## 2012-03-26 NOTE — H&P (View-Only) (Signed)
Paul Gaines  DOB: Dec 05, 1932 Married / Language: English / Race: Refused to Report/Unreported Male  Date of Admission:  03/26/2012  Chief Complaint:  Left Knee Pain  History of Present Illness The patient is a 78 year old male who comes in for a preoperative History and Physical. The patient is scheduled for a left total knee arthroplasty to be performed by Dr. Gus Rankin. Aluisio, MD at [facility] on 03/26/2012. The patient is a 78 year old male who presents for follow up of their knee. The patient is being followed for their left knee pain and osteoarthritis. Symptoms reported today include: pain (with extended walking), pain at night and swelling (into lower leg and ankle). The following medication has been used for pain control: antiinflammatory medication (Naproxen). The patient indicates that they have questions or concerns today regarding whether or not to repeat Synvisc, not much relief with last injections. He's got advanced end stage arthritis of the left knee with progressively worsening pain and dysfunction. Injections have not helped. At this point he says he needs a more permanent solution. I would recommend knee replacement surgery. They have been treated conservatively in the past for the above stated problem and despite conservative measures, they continue to have progressive pain and severe functional limitations and dysfunction. They have failed non-operative management including home exercise, medications, and injections. It is felt that they would benefit from undergoing total joint replacement. Risks and benefits of the procedure have been discussed with the patient and they elect to proceed with surgery. There are no active contraindications to surgery such as ongoing infection or rapidly progressive neurological disease.  Problem List/Past Medical Osteoarthritis, knee (715.96) Coronary artery disease High blood pressure Hypercholesterolemia Skin  Cancer Hypercholesterolemia Myocardial infarction. Date: 65. Measles Stent. 2000 Coronary Artery Disease/Heart Disease Macular Degeneration Impaired Vision Hypertension COPD   Allergies CELEBREX. 04/20/2006 Thinks it may have caused his heart attack VIOXX. 04/20/2006 Chest pain.   Family History Mother. Dementia Father. Cancer.   Social History Alcohol use. Occasional alcohol use. 1 Beer per week current drinker; drinks wine; only occasionally per week Children. 8 5 or more Current work status. retired Financial planner (Currently). no Drug/Alcohol Rehab (Previously). no Exercise. Exercises weekly; does gym / weights Illicit drug use. no Living situation. Lives with spouse. live with spouse Marital status. married Number of flights of stairs before winded. greater than 5 Pain Contract. no Tobacco / smoke exposure. no Tobacco use. Former smoker. quit over 30 years ago former smoker Post-Surgical Plans. Plan is for home. Advance Directives. Living Will   Medication History Multiple Vitamin (1 Oral) Active. Aspirin (81MG  Tablet, 1 Oral) Active. Timolol Maleate (0.5% Solution, Ophthalmic) Active. PrednisoLONE Acetate (1% Suspension, Ophthalmic) Active. Pantoprazole Sodium (40MG  Tablet DR, Oral) Active. Pravastatin Sodium (40MG  Tablet, Oral) Active. Carvedilol (25MG  Tablet, Oral) Active. Doxazosin Mesylate (4MG  Tablet, Oral) Active. AmLODIPine Besylate (5MG  Tablet, Oral) Active. Probiotic ( Oral) Specific dose unknown - Active. Naproxen Sodium (500MG  Tablet ER, Oral) Active. (prn) Nitrostat (0.4MG  Tab Sublingual, Sublingual) Active. (prn)   Past Surgical History Colon Polyp Removal - Colonoscopy Heart Stents Tonsillectomy Total Knee Replacement. left Cardiac Pacemaker Insertion Hernia Repair Cardiac Cath with Stent. Date: 2000. Cardiac Cath with [place]on Angioplasty Root Canal   Review of Systems General:Not Present-  Chills, Fever, Night Sweats, Appetite Loss, Fatigue, Feeling sick, Weight Gain and Weight Loss. HEENT:Not Present- Sensitivity to light, Hearing problems, Nose Bleed and Ringing in the Ears. Neck:Not Present- Swollen Glands and Neck Mass. Respiratory:Not Present-  Snoring, Chronic Cough, Bloody sputum and Dyspnea. Cardiovascular:Not Present- Shortness of Breath, Chest Pain, Swelling of Extremities, Leg Cramps and Palpitations. Gastrointestinal:Not Present- Bloody Stool, Heartburn, Abdominal Pain, Vomiting, Nausea and Incontinence of Stool. Male Genitourinary:Not Present- Blood in Urine, Frequency, Incontinence and Nocturia. Musculoskeletal:Present- Joint Stiffness. Not Present- Muscle Weakness, Muscle Pain, Joint Swelling, Joint Pain and Back Pain. Neurological:Not Present- Tingling, Numbness, Burning, Tremor, Headaches and Dizziness. Endocrine:Not Present- Cold Intolerance, Heat Intolerance, Excessive hunger and Excessive Thirst. Hematology:Not Present- Abnormal Bleeding, Anemia, Blood Clots and Easy Bruising.   Vitals Weight: 207 lb Height: 71 in Body Surface Area: 2.17 m Body Mass Index: 28.87 kg/m Pulse: 56 (Regular) Resp.: 14 (Unlabored) BP: 110/60 (Sitting, Right Arm, Standard)    Physical Exam The physical exam findings are as follows:  Note: Patient is a 78 year old male with continued knee pain.   General Mental Status - Alert, cooperative and good historian. General Appearance- pleasant. Not in acute distress. Orientation- Oriented X3. Build & Nutrition- Well nourished and Well developed.   Head and Neck Head- normocephalic, atraumatic . Neck Global Assessment- supple. no bruit auscultated on the right and no bruit auscultated on the left.   Eye Pupil- Bilateral- Regular and Round. Motion- Bilateral- EOMI. wears glasses  Chest and Lung Exam Auscultation: Breath sounds:- clear at anterior chest wall and - clear at  posterior chest wall. Adventitious sounds:- No Adventitious sounds.   Cardiovascular Auscultation:Rhythm- Regular rate and rhythm. Heart Sounds- S1 WNL and S2 WNL. Murmurs & Other Heart Sounds:Auscultation of the heart reveals - No Murmurs.   Abdomen Palpation/Percussion:Tenderness- Abdomen is non-tender to palpation. Rigidity (guarding)- Abdomen is soft. Auscultation:Auscultation of the abdomen reveals - Bowel sounds normal.   Male Genitourinary Not done, not pertinent to present illness  Musculoskeletal  On exam, he's a well-developed male, alert and oriented, in no apparent distress. His left knee shows no effusion. There is a varus deformity. ROM is 5-120. There is marked crepitus on ROM. He is tender medial greater than lateral with no instability. Right knee exam is unremarkable. He hasn't had x-rays in almost 2 1/2 years, so we obtained x-rays today.  RADIOGRAPHS: He has severe bone on bone arthritis, all 3 compartments, with subchondral sclerosis and osteophyte formation. Arthritis is worse in the medial compartment but present in all 3.  Assessment & Plan Osteoarthritis, knee (715.96) Impression: Left Knee  Note: Patient is for a Left Total Knee Replacement by Dr. Lequita Halt.  PCP - Dr. Ellsworth Lennox Cards - Dr. Welton Flakes  ECHO EF 55-75% per exam on 10/27/11.  Plan is to go to rehab for a short stay after the surgery. He is looking into [facility]	[place] in [place], [place].  Signed electronically by Roberts Gaudy, PA-C

## 2012-03-26 NOTE — Progress Notes (Signed)
Utilization review completed. Referral made to social worker for placement.  Prefers [facility] in [place] per MD office notes

## 2012-03-26 NOTE — Transfer of Care (Signed)
Immediate Anesthesia Transfer of Care Note  Patient: Paul Gaines  Procedure(s) Performed: Procedure(s) (LRB) with comments: TOTAL KNEE ARTHROPLASTY (Left)  Patient Location: PACU  Anesthesia Type: Regional and Spinal  Level of Consciousness: awake, alert , oriented and patient cooperative  Airway & Oxygen Therapy: Patient Spontanous Breathing and Patient connected to face mask oxygen  Post-op Assessment: Report given to PACU RN and Post -op Vital signs reviewed and stable  Post vital signs: Reviewed and stable  Complications: No apparent anesthesia complications

## 2012-03-26 NOTE — Anesthesia Procedure Notes (Signed)
Spinal  Patient location during procedure: OR Start time: 03/26/2012 9:30 AM End time: 03/26/2012 9:36 AM Staffing Performed by: anesthesiologist  Preanesthetic Checklist Completed: patient identified, site marked, surgical consent, pre-op evaluation, timeout performed, IV checked, risks and benefits discussed and monitors and equipment checked Spinal Block Patient position: sitting Prep: Betadine Patient monitoring: heart rate, continuous pulse ox and blood pressure Injection technique: single-shot Needle Needle type: Spinocan  Needle gauge: 22 G Needle length: 9 cm Additional Notes Expiration date of kit checked and confirmed. Patient tolerated procedure well, without complications.

## 2012-03-26 NOTE — Interval H&P Note (Signed)
History and Physical Interval Note:  03/26/2012 9:14 AM  Paul Gaines  has presented today for surgery, with the diagnosis of osteoarthritis left knee  The various methods of treatment have been discussed with the patient and family. After consideration of risks, benefits and other options for treatment, the patient has consented to  Procedure(s) (LRB) with comments: TOTAL KNEE ARTHROPLASTY (Left) as a surgical intervention .  The patient's history has been reviewed, patient examined, no change in status, stable for surgery.  I have reviewed the patient's chart and labs.  Questions were answered to the patient's satisfaction.     Loanne Drilling

## 2012-03-26 NOTE — Preoperative (Signed)
Beta Blockers   Reason not to administer Beta Blockers:coreg taken this am [phone] per pt

## 2012-03-26 NOTE — Anesthesia Preprocedure Evaluation (Addendum)
Anesthesia Evaluation  Patient identified by MRN, date of birth, ID band Patient awake    Reviewed: Allergy & Precautions, H&P , NPO status , Patient's Chart, lab work & pertinent test results  History of Anesthesia Complications (+) AWARENESS UNDER ANESTHESIA  Airway Mallampati: II TM Distance: <3 FB Neck ROM: Full    Dental No notable dental hx.    Pulmonary neg pulmonary ROS,  breath sounds clear to auscultation  Pulmonary exam normal       Cardiovascular hypertension, + CAD, + Past MI and + Cardiac Stents negative cardio ROS  Rhythm:Regular Rate:Normal     Neuro/Psych negative neurological ROS  negative psych ROS   GI/Hepatic negative GI ROS, Neg liver ROS,   Endo/Other  negative endocrine ROS  Renal/GU negative Renal ROS  negative genitourinary   Musculoskeletal negative musculoskeletal ROS (+)   Abdominal   Peds negative pediatric ROS (+)  Hematology negative hematology ROS (+)   Anesthesia Other Findings   Reproductive/Obstetrics negative OB ROS                          Anesthesia Physical Anesthesia Plan  ASA: III  Anesthesia Plan: Spinal   Post-op Pain Management:    Induction: Intravenous  Airway Management Planned: Simple Face Mask  Additional Equipment:   Intra-op Plan:   Post-operative Plan:   Informed Consent: I have reviewed the patients History and Physical, chart, labs and discussed the procedure including the risks, benefits and alternatives for the proposed anesthesia with the patient or authorized representative who has indicated his/her understanding and acceptance.     Plan Discussed with: CRNA and Surgeon  Anesthesia Plan Comments:         Anesthesia Quick Evaluation

## 2012-03-26 NOTE — Op Note (Signed)
Pre-operative diagnosis- Osteoarthritis  Left knee(s)  Post-operative diagnosis- Osteoarthritis Left knee(s)  Procedure-  Left  Total Knee Arthroplasty  Surgeon- Paul Gaines. Paul Runions, MD  Assistant- Paul Able, PA-C   Anesthesia-  Spinal EBL-* No blood loss amount entered *  Drains Hemovac  Tourniquet time-  Total Tourniquet Time Documented: Thigh (Left) - 37 minutes   Complications- None  Condition-PACU - hemodynamically stable.   Brief Clinical Note   Paul Gaines is a 78 y.o. year old male with end stage OA of his left knee with progressively worsening pain and dysfunction. He has constant pain, with activity and at rest and significant functional deficits with difficulties even with ADLs. He has had extensive non-op management including analgesics, injections of cortisone and viscosupplements, and home exercise program, but remains in significant pain with significant dysfunction. Radiographs show bone on bone arthritis all 3 compartments. He presents now for left Total Knee Arthroplasty.     Procedure in detail---   The patient is brought into the operating room and positioned supine on the operating table. After successful administration of  Spinal,   a tourniquet is placed high on the  Left thigh(s) and the lower extremity is prepped and draped in the usual sterile fashion. Time out is performed by the operating team and then the  Left lower extremity is wrapped in Esmarch, knee flexed and the tourniquet inflated to 300 mmHg.       A midline incision is made with a ten blade through the subcutaneous tissue to the level of the extensor mechanism. A fresh blade is used to make a medial parapatellar arthrotomy. Soft tissue over the proximal medial tibia is subperiosteally elevated to the joint line with a knife and into the semimembranosus bursa with a Cobb elevator. Soft tissue over the proximal lateral tibia is elevated with attention being paid to avoiding the patellar tendon on the  tibial tubercle. The patella is everted, knee flexed 90 degrees and the ACL and PCL are removed. Findings are bone on bone all 3 compartments with large global osteophytes        The drill is used to create a starting hole in the distal femur and the canal is thoroughly irrigated with sterile saline to remove the fatty contents. The 5 degree Left  valgus alignment guide is placed into the femoral canal and the distal femoral cutting block is pinned to remove 11 mm off the distal femur. Resection is made with an oscillating saw.      The tibia is subluxed forward and the menisci are removed. The extramedullary alignment guide is placed referencing proximally at the medial aspect of the tibial tubercle and distally along the second metatarsal axis and tibial crest. The block is pinned to remove 2mm off the more deficient medial  side. Resection is made with an oscillating saw. Size 4is the most appropriate size for the tibia and the proximal tibia is prepared with the modular drill and keel punch for that size.      The femoral sizing guide is placed and size 4 is most appropriate. Rotation is marked off the epicondylar axis and confirmed by creating a rectangular flexion gap at 90 degrees. The size 4 cutting block is pinned in this rotation and the anterior, posterior and chamfer cuts are made with the oscillating saw. The intercondylar block is then placed and that cut is made.      Trial size 4 tibial component, trial size 4 posterior stabilized femur and a 12.5  mm posterior stabilized rotating platform insert trial is placed. Full extension is achieved with excellent varus/valgus and anterior/posterior balance throughout full range of motion. The patella is everted and thickness measured to be 26  mm. Free hand resection is taken to 15 mm, a 38 template is placed, lug holes are drilled, trial patella is placed, and it tracks normally. Osteophytes are removed off the posterior femur with the trial in place.  All trials are removed and the cut bone surfaces prepared with pulsatile lavage. Cement is mixed and once ready for implantation, the size 4 tibial implant, size  4 posterior stabilized femoral component, and the size 38 patella are cemented in place and the patella is held with the clamp. The trial insert is placed and the knee held in full extension. All extruded cement is removed and once the cement is hard the permanent 12.5 mm posterior stabilized rotating platform insert is placed into the tibial tray.      The wound is copiously irrigated with saline solution and the extensor mechanism closed over a hemovac drain with #1 PDS suture. The tourniquet is released for a total tourniquet time of 35  minutes. Flexion against gravity is 140 degrees and the patella tracks normally. Subcutaneous tissue is closed with 2.0 vicryl and subcuticular with running 4.0 Monocryl. The catheter for the Marcaine pain pump is placed and the pump is initiated. The incision is cleaned and dried and steri-strips and a bulky sterile dressing are applied. The limb is placed into a knee immobilizer and the patient is awakened and transported to recovery in stable condition.      Please note that a surgical assistant was a medical necessity for this procedure in order to perform it in a safe and expeditious manner. Surgical assistant was necessary to retract the ligaments and vital neurovascular structures to prevent injury to them and also necessary for proper positioning of the limb to allow for anatomic placement of the prosthesis.   Paul Gaines Paul Loiseau, MD    03/26/2012, 10:25 AM

## 2012-03-26 NOTE — Anesthesia Postprocedure Evaluation (Signed)
  Anesthesia Post-op Note  Patient: Paul Gaines  Procedure(s) Performed: Procedure(s) (LRB): TOTAL KNEE ARTHROPLASTY (Left)  Patient Location: PACU  Anesthesia Type: Spinal  Level of Consciousness: awake and alert   Airway and Oxygen Therapy: Patient Spontanous Breathing  Post-op Pain: mild  Post-op Assessment: Post-op Vital signs reviewed, Patient's Cardiovascular Status Stable, Respiratory Function Stable, Patent Airway and No signs of Nausea or vomiting  Post-op Vital Signs: stable  Complications: No apparent anesthesia complications

## 2012-03-27 ENCOUNTER — Encounter (HOSPITAL_COMMUNITY): Payer: Self-pay | Admitting: Orthopedic Surgery

## 2012-03-27 LAB — BASIC METABOLIC PANEL
BUN: 18 mg/dL (ref 6–23)
Calcium: 8.4 mg/dL (ref 8.4–10.5)
Creatinine, Ser: 1.21 mg/dL (ref 0.50–1.35)
GFR calc non Af Amer: 56 mL/min — ABNORMAL LOW (ref >90)
Glucose, Bld: 145 mg/dL — ABNORMAL HIGH (ref 70–99)

## 2012-03-27 LAB — CBC
HCT: 30.7 % — ABNORMAL LOW (ref 39.0–52.0)
Hemoglobin: 10.4 g/dL — ABNORMAL LOW (ref 13.0–17.0)
MCH: 30.4 pg (ref 26.0–34.0)
MCHC: 33.9 g/dL (ref 30.0–36.0)
MCV: 89.8 fL (ref 78.0–100.0)

## 2012-03-27 MED ORDER — MORPHINE SULFATE 2 MG/ML IJ SOLN
1.0000 mg | INTRAMUSCULAR | Status: DC | PRN
  Administered 2012-03-27: 2 mg via INTRAVENOUS
  Filled 2012-03-27: qty 1

## 2012-03-27 MED FILL — Bupivacaine HCl Inj 0.25%: Qty: 300 | Status: AC

## 2012-03-27 NOTE — Progress Notes (Signed)
   Subjective: 1 Day Post-Op Procedure(s) (LRB): TOTAL KNEE ARTHROPLASTY (Left) Patient reports pain as mild.   Patient seen in rounds with Dr. Lequita Halt. Patient is well, and has had no acute complaints or problems We will start therapy today.  Plan is to go Skilled nursing facility after hospital stay.  Objective: Vital signs in last 24 hours: Temp:  [97.6 F (36.4 C)-99 F (37.2 C)] 98 F (36.7 C) (09/10 0558) Pulse Rate:  [45-69] 69  (09/10 0558) Resp:  [9-17] 12  (09/10 0558) BP: (108-159)/(60-96) 125/71 mmHg (09/10 0558) SpO2:  [95 %-100 %] 95 % (09/10 0558) Weight:  [95.255 kg (210 lb)] 95.255 kg (210 lb) (09/09 1210)  Intake/Output from previous day:  Intake/Output Summary (Last 24 hours) at 03/27/12 0747 Last data filed at 03/27/12 0559  Gross per 24 hour  Intake 4031.25 ml  Output   2055 ml  Net 1976.25 ml    Intake/Output this shift:    Labs:  Basename 03/27/12 0403  HGB 10.4*    Basename 03/27/12 0403  WBC 10.8*  RBC 3.42*  HCT 30.7*  PLT 110*    Basename 03/27/12 0403  NA 136  K 4.6  CL 102  CO2 28  BUN 18  CREATININE 1.21  GLUCOSE 145*  CALCIUM 8.4   No results found for this basename: LABPT:2,INR:2 in the last 72 hours  EXAM General - Patient is Alert, Appropriate and Oriented Extremity - Neurovascular intact Sensation intact distally Dorsiflexion/Plantar flexion intact Dressing - dressing C/D/I Motor Function - intact, moving foot and toes well on exam.  Hemovac pulled without difficulty.  Past Medical History  Diagnosis Date  . Myocardial infarction   . Coronary artery disease     stent - 2001   . Hypertension   . Asthma   . Shortness of breath     with exertion   . Pneumonia     hx of several times per pt   . Chronic kidney disease     frequency  . Arthritis   . Cancer     hx of skin cancer on nose  . Legally blind     Assessment/Plan: 1 Day Post-Op Procedure(s) (LRB): TOTAL KNEE ARTHROPLASTY (Left) Principal  Problem:  *OA (osteoarthritis) of knee   Advance diet Up with therapy Discharge to SNF - Wants to look into [place].  DVT Prophylaxis - Xarelto Weight-Bearing as tolerated to left leg No vaccines. D/C PCA Morphine, Change to IV push D/C O2 and Pulse OX and try on Room Air  Patrica Duel 03/27/2012, 7:47 AM

## 2012-03-27 NOTE — Progress Notes (Signed)
Clinical Social Work Department CLINICAL SOCIAL WORK PLACEMENT NOTE 03/27/2012  Patient:  Paul Gaines, Paul Gaines  Account Number:  [id number] Admit date:  03/26/2012  Clinical Social Worker:  Cori Razor, LCSW  Date/time:  03/27/2012 02:35 PM  Clinical Social Work is seeking post-discharge placement for this patient at the following level of care:   SKILLED NURSING   (*CSW will update this form in Epic as items are completed)     Patient/family provided with [facility] Department of Clinical Social Work's list of facilities offering this level of care within the geographic area requested by the patient (or if unable, by the patient's family).    Patient/family informed of their freedom to choose among providers that offer the needed level of care, that participate in Medicare, Medicaid or managed care program needed by the patient, have an available bed and are willing to accept the patient.    Patient/family informed of [facility]' ownership interest in [facility], as well as of the fact that they are under no obligation to receive care at this facility.  PASARR submitted to EDS on 03/27/2012 PASARR number received from EDS on 03/27/2012  FL2 transmitted to all facilities in geographic area requested by pt/family on  03/27/2012 FL2 transmitted to all facilities within larger geographic area on   Patient informed that his/her managed care company has contracts with or will negotiate with  certain facilities, including the following:     Patient/family informed of bed offers received:  03/27/2012 Patient chooses bed at [facility] Physician recommends and patient chooses bed at    Patient to be transferred to [facility] on   Patient to be transferred to facility by   The following physician request were entered in Epic:   Additional Comments:  Cori Razor LCSW [phone]

## 2012-03-27 NOTE — Progress Notes (Signed)
Clinical Social Work Department BRIEF PSYCHOSOCIAL ASSESSMENT 03/27/2012  Patient:  KEEVEN, MATTY     Account Number:  [id number]     Admit date:  03/26/2012  Clinical Social Worker:  Candie Chroman  Date/Time:  03/27/2012 02:25 PM  Referred by:  Physician  Date Referred:  03/27/2012 Referred for  SNF Placement   Other Referral:   Interview type:  Patient Other interview type:    PSYCHOSOCIAL DATA Living Status:  WIFE Admitted from facility:   Level of care:   Primary support name:  Kathryne Eriksson Primary support relationship to patient:  SPOUSE Degree of support available:   supportive    CURRENT CONCERNS Current Concerns  Post-Acute Placement   Other Concerns:    SOCIAL WORK ASSESSMENT / PLAN Pt is a 78 yr old gentleman living at home prior to hospitalization. CSW met with pt/spouse to assist with d/c planning. Pt has made prior arrangements to have ST rehab at [facility] place following hospital d/c. CSW has contacted SNF and d/c plan has been confirmed. CSW has requested prior approval from [facility].   Assessment/plan status:  Psychosocial Support/Ongoing Assessment of Needs Other assessment/ plan:   Information/referral to community resources:   Insurance coverage for SNF placement reviewed.    PATIENT'S/FAMILY'S RESPONSE TO PLAN OF CARE: Pt is looking forward to rehab at [facility].   Cori Razor LCSW [phone]

## 2012-03-27 NOTE — Progress Notes (Signed)
Physical Therapy Treatment Patient Details Name: Paul Gaines MRN: [id number] DOB: [birth year] Today's Date: 03/27/2012 Time: 4098-1191 PT Time Calculation (min): 28 min  PT Assessment / Plan / Recommendation Comments on Treatment Session       Follow Up Recommendations  Skilled nursing facility    Barriers to Discharge        Equipment Recommendations  Defer to next venue    Recommendations for Other Services OT consult  Frequency 7X/week   Plan Discharge plan remains appropriate    Precautions / Restrictions Precautions Precautions: Knee Required Braces or Orthoses: Knee Immobilizer - Left Knee Immobilizer - Left: Discontinue once straight leg raise with < 10 degree lag Restrictions Weight Bearing Restrictions: No Other Position/Activity Restrictions: WBAT   Pertinent Vitals/Pain 4/10; premedicated    Mobility  Bed Mobility Bed Mobility: Supine to Sit;Sit to Supine Supine to Sit: 4: Min assist Sit to Supine: 4: Min assist Details for Bed Mobility Assistance: cues for sequence and use of R LE to self assist  Transfers Transfers: Sit to Stand;Stand to Sit Sit to Stand: 4: Min assist Stand to Sit: 4: Min assist Details for Transfer Assistance: cues for LE management and use of UEs Ambulation/Gait Ambulation/Gait Assistance: 4: Min assist Ambulation Distance (Feet): 75 Feet Assistive device: Rolling walker Ambulation/Gait Assistance Details: cues for posture, stride length and position from RW Gait Pattern: Step-to pattern Stairs: No    Exercises     PT Diagnosis:    PT Problem List:   PT Treatment Interventions:     PT Goals Acute Rehab PT Goals PT Goal Formulation: With patient Time For Goal Achievement: 03/30/12 Potential to Achieve Goals: Good Pt will go Supine/Side to Sit: with supervision PT Goal: Supine/Side to Sit - Progress: Progressing toward goal Pt will go Sit to Supine/Side: with supervision PT Goal: Sit to Supine/Side - Progress:  Progressing toward goal Pt will go Sit to Stand: with supervision PT Goal: Sit to Stand - Progress: Progressing toward goal Pt will go Stand to Sit: with supervision PT Goal: Stand to Sit - Progress: Progressing toward goal Pt will Ambulate: 51 - 150 feet;with supervision;with rolling walker PT Goal: Ambulate - Progress: Progressing toward goal  Visit Information  Last PT Received On: 03/27/12 Assistance Needed: +1    Subjective Data  Subjective: I have to pee or they will put the catheter back in Patient Stated Goal: Rehab and home   Cognition  Overall Cognitive Status: Appears within functional limits for tasks assessed/performed Arousal/Alertness: Awake/alert Orientation Level: Appears intact for tasks assessed Behavior During Session: [facility] for tasks performed    Balance     End of Session PT - End of Session Equipment Utilized During Treatment: Left knee immobilizer Activity Tolerance: Patient tolerated treatment well Patient left: in bed;with call bell/phone within reach;with family/visitor present Nurse Communication: Mobility status   GP     Paul Gaines 03/27/2012, 3:20 PM

## 2012-03-27 NOTE — Care Management Note (Signed)
    Page 1 of 2   03/27/2012     5:09:12 PM   CARE MANAGEMENT NOTE 03/27/2012  Patient:  Paul Gaines, Paul Gaines   Account Number:  [id number]  Date Initiated:  03/27/2012  Documentation initiated by:  Colleen Can  Subjective/Objective Assessment:   DX OSTEOARTHRITIS LEFT KNEE; TOTAL KNEE REPLACEMNT     Action/Plan:   CM SPOKE WITH PATIENT. pLANS ARE FOR PATIENT TO GO TO SNF FOR REHAB   Anticipated DC Date:  03/29/2012   Anticipated DC Plan:  SKILLED NURSING FACILITY  In-house referral  Clinical Social Worker      DC Planning Services  CM consult      [facility] Choice  NA   Choice offered to / List presented to:  NA   DME arranged  NA      DME agency  NA     HH arranged  NA      HH agency  NA   Status of service:  Completed, signed off Medicare Important Message given?  NA - LOS <3 / Initial given by admissions (If response is "NO", the following Medicare IM given date fields will be blank) Date Medicare IM given:   Date Additional Medicare IM given:    Discharge Disposition:    Per UR Regulation:  Reviewed for med. necessity/level of care/duration of stay  If discussed at Long Length of Stay Meetings, dates discussed:    Comments:

## 2012-03-27 NOTE — Evaluation (Signed)
Physical Therapy Evaluation Patient Details Name: Paul Gaines MRN: [id number] DOB: [birth year] Today's Date: 03/27/2012 Time: 4098-1191 PT Time Calculation (min): 25 min  PT Assessment / Plan / Recommendation Clinical Impression  PT s/p L TKR presents with decreased L LE strength/ROM limiting functional mobility    PT Assessment  Patient needs continued PT services    Follow Up Recommendations  Skilled nursing facility    Barriers to Discharge        Equipment Recommendations  Defer to next venue    Recommendations for Other Services OT consult   Frequency 7X/week    Precautions / Restrictions Precautions Precautions: Knee Required Braces or Orthoses: Knee Immobilizer - Left Knee Immobilizer - Left: Discontinue once straight leg raise with < 10 degree lag Restrictions Weight Bearing Restrictions: No Other Position/Activity Restrictions: WBAT   Pertinent Vitals/Pain 6/10; pt premedicated, ice packs provided      Mobility  Bed Mobility Bed Mobility: Supine to Sit Supine to Sit: 4: Min assist Details for Bed Mobility Assistance: cues for sequence and use of R LE to self assist  Transfers Transfers: Sit to Stand;Stand to Sit Sit to Stand: 4: Min assist Stand to Sit: 4: Min assist Details for Transfer Assistance: cues for LE management and use of UEs Ambulation/Gait Ambulation/Gait Assistance: 4: Min Environmental consultant (Feet): 37 Feet Assistive device: Rolling walker Ambulation/Gait Assistance Details: cues for sequence, posture, BOS and position from RW Gait Pattern: Step-to pattern Stairs: No    Exercises Total Joint Exercises Ankle Circles/Pumps: AROM;Both;10 reps;Supine Quad Sets: AROM;10 reps;Supine;Both Heel Slides: AAROM;10 reps;Supine;Left Straight Leg Raises: AAROM;10 reps;Left;Supine   PT Diagnosis: Difficulty walking  PT Problem List: Decreased strength;Decreased range of motion;Decreased activity tolerance;Decreased mobility;Decreased  knowledge of use of DME;Pain PT Treatment Interventions: DME instruction;Gait training;Stair training;Functional mobility training;Therapeutic activities;Therapeutic exercise;Patient/family education   PT Goals Acute Rehab PT Goals PT Goal Formulation: With patient Time For Goal Achievement: 03/30/12 Potential to Achieve Goals: Good Pt will go Supine/Side to Sit: with supervision PT Goal: Supine/Side to Sit - Progress: Goal set today Pt will go Sit to Supine/Side: with supervision PT Goal: Sit to Supine/Side - Progress: Goal set today Pt will go Sit to Stand: with supervision PT Goal: Sit to Stand - Progress: Goal set today Pt will go Stand to Sit: with supervision PT Goal: Stand to Sit - Progress: Goal set today Pt will Ambulate: 51 - 150 feet;with supervision;with rolling walker PT Goal: Ambulate - Progress: Goal set today  Visit Information  Last PT Received On: 03/27/12 Assistance Needed: +1    Subjective Data  Subjective: I want to go to [facility], my wife would have to struggle to help me Patient Stated Goal: Rehab and home   Prior Functioning  Home Living Lives With: Spouse Prior Function Level of Independence: Independent Able to Take Stairs?: Yes Driving: No Communication Communication: No difficulties    Cognition  Overall Cognitive Status: Appears within functional limits for tasks assessed/performed Arousal/Alertness: Awake/alert Orientation Level: Appears intact for tasks assessed Behavior During Session: [facility] for tasks performed    Extremity/Trunk Assessment Right Upper Extremity Assessment RUE ROM/Strength/Tone: [facility] for tasks assessed Left Upper Extremity Assessment LUE ROM/Strength/Tone: WFL for tasks assessed Right Lower Extremity Assessment RLE ROM/Strength/Tone: [facility] for tasks assessed Left Lower Extremity Assessment LLE ROM/Strength/Tone: Deficits LLE ROM/Strength/Tone Deficits: 2/5 quads, aarom at knee -10 - 35   Balance    End of Session PT -  End of Session Equipment Utilized During Treatment: Left knee immobilizer Activity Tolerance: Patient  tolerated treatment well Patient left: in chair;with call bell/phone within reach Nurse Communication: Mobility status CPM Left Knee CPM Left Knee: Off  GP     Annina Piotrowski 03/27/2012, 8:43 AM

## 2012-03-28 DIAGNOSIS — E871 Hypo-osmolality and hyponatremia: Secondary | ICD-10-CM | POA: Diagnosis not present

## 2012-03-28 DIAGNOSIS — D62 Acute posthemorrhagic anemia: Secondary | ICD-10-CM | POA: Diagnosis not present

## 2012-03-28 LAB — BASIC METABOLIC PANEL WITH GFR
BUN: 20 mg/dL (ref 6–23)
CO2: 29 meq/L (ref 19–32)
Calcium: 8.5 mg/dL (ref 8.4–10.5)
Chloride: 99 meq/L (ref 96–112)
Creatinine, Ser: 1.29 mg/dL (ref 0.50–1.35)
GFR calc Af Amer: 60 mL/min — ABNORMAL LOW
GFR calc non Af Amer: 52 mL/min — ABNORMAL LOW
Glucose, Bld: 152 mg/dL — ABNORMAL HIGH (ref 70–99)
Potassium: 4.4 meq/L (ref 3.5–5.1)
Sodium: 133 meq/L — ABNORMAL LOW (ref 135–145)

## 2012-03-28 LAB — CBC
HCT: 27 % — ABNORMAL LOW (ref 39.0–52.0)
Hemoglobin: 9.3 g/dL — ABNORMAL LOW (ref 13.0–17.0)
MCH: 30.5 pg (ref 26.0–34.0)
MCHC: 34.4 g/dL (ref 30.0–36.0)
MCV: 88.5 fL (ref 78.0–100.0)
Platelets: 102 K/uL — ABNORMAL LOW (ref 150–400)
RBC: 3.05 MIL/uL — ABNORMAL LOW (ref 4.22–5.81)
RDW: 13.9 % (ref 11.5–15.5)
WBC: 11.9 K/uL — ABNORMAL HIGH (ref 4.0–10.5)

## 2012-03-28 MED ORDER — POLYSACCHARIDE IRON COMPLEX 150 MG PO CAPS
150.0000 mg | ORAL_CAPSULE | Freq: Every day | ORAL | Status: DC
  Administered 2012-03-28 – 2012-03-30 (×3): 150 mg via ORAL
  Filled 2012-03-28 (×3): qty 1

## 2012-03-28 NOTE — Progress Notes (Signed)
Physical Therapy Treatment Patient Details Name: Rodgers Likes MRN: [id number] DOB: [birth year] Today's Date: 03/28/2012 Time: 4098-1191 PT Time Calculation (min): 15 min  PT Assessment / Plan / Recommendation Comments on Treatment Session  POD # 2 pm session.  Assisted pt OOB to BR then amb in hallway.  Noted increased dyspnea.  RA 90%, HR 109.  Instructed pt on proper breathing and returned to bed for the CPM. Pt plans to D/C to SNF for Rehab.    Follow Up Recommendations  Skilled nursing facility    Barriers to Discharge        Equipment Recommendations  Defer to next venue    Recommendations for Other Services    Frequency 7X/week   Plan Discharge plan remains appropriate    Precautions / Restrictions Precautions Precautions: None Precaution Comments: Pt instructed on KI use for amb.  Pt legally blind Required Braces or Orthoses: Knee Immobilizer - Left Knee Immobilizer - Left: Discontinue once straight leg raise with < 10 degree lag Restrictions Weight Bearing Restrictions: No Other Position/Activity Restrictions: WBAT    Pertinent Vitals/Pain C/o 6/10 Pre medicated    Mobility   Bed Mobility Bed Mobility: Supine to Sit;Sit to Supine Supine to Sit: 4: Min assist Sit to Supine: 4: Min assist Details for Bed Mobility Assistance: Assissted pt OOB and back to bed with increased time and assist to support L LE  Transfers Transfers: Sit to Stand;Stand to Sit Sit to Stand: 4: Min guard;4: Min assist;From toilet;From bed Stand to Sit: 4: Min guard;4: Min assist;To toilet;To bed Details for Transfer Assistance: <25% VC's on safety with turns and hand placement with stand to sit  Ambulation/Gait Ambulation/Gait Assistance: 4: Min guard;4: Min assist Ambulation Distance (Feet): 85 Feet Assistive device: Rolling walker Ambulation/Gait Assistance Details: 25% VC's on proper walker to self distance and upright posture  50% VC's on purse lip breathing as RA dropped to 90  with amb and noted mild dyspnea. Gait Pattern: Step-to pattern;Decreased stance time - left Gait velocity: decreased        PT Goals                  progressing    Visit Information  Last PT Received On: 03/28/12 Assistance Needed: +1             End of Session PT - End of Session Equipment Utilized During Treatment: Gait belt;Left knee immobilizer Activity Tolerance: Patient limited by fatigue Patient left: in bed;with call bell/phone within reach Nurse Communication: Mobility status   [name]  PTA [facility]  Acute  Rehab Pager     [phone]

## 2012-03-28 NOTE — Evaluation (Signed)
Occupational Therapy Evaluation Patient Details Name: Paul Gaines MRN: [id number] DOB: May 07, 1933 Today's Date: 03/28/2012 Time: 1308-6578 OT Time Calculation (min): 30 min  OT Assessment / Plan / Recommendation Clinical Impression  This 78 year old man was admitted for L TKA.  He was independent prior to admission and now requires min A for mobility and overall mod A for LB ADLs.  He will benefit from continued OT with min guard level goals in acute    OT Assessment  Patient needs continued OT Services    Follow Up Recommendations  Skilled nursing facility    Barriers to Discharge      Equipment Recommendations  Defer to next venue    Recommendations for Other Services    Frequency  Min 2X/week    Precautions / Restrictions Precautions Required Braces or Orthoses: Knee Immobilizer - Left Knee Immobilizer - Left: Discontinue once straight leg raise with < 10 degree lag Restrictions Weight Bearing Restrictions: Yes Other Position/Activity Restrictions: WBAT   Pertinent Vitals/Pain None in bed; 5/10 L knee standing:  Repositioned and ice applied    ADL  Eating/Feeding: Simulated;Independent Where Assessed - Eating/Feeding: Bed level Grooming: Performed;Set up Where Assessed - Grooming: Unsupported sitting Upper Body Bathing: Performed;Set up Where Assessed - Upper Body Bathing: Unsupported sitting Lower Body Bathing: Performed;Moderate assistance Where Assessed - Lower Body Bathing: Supported sit to stand Upper Body Dressing: Performed;Set up Where Assessed - Upper Body Dressing: Unsupported sitting Lower Body Dressing: Simulated;Moderate assistance Where Assessed - Lower Body Dressing: Supported sit to stand Toilet Transfer: Mining engineer Method: Sit to stand (bed to chair) Toileting - Clothing Manipulation and Hygiene: Simulated;Moderate assistance (balance) Where Assessed - Toileting Clothing Manipulation and Hygiene: Sit to stand  from 3-in-1 or toilet Equipment Used: Rolling walker Transfers/Ambulation Related to ADLs: min cues for hand placement with transfers ADL Comments: during bathing, in standing, pt leaned posteriorly:  cues to weight shift forward--mod A given at times.  Pt did have good safety with one hand on walker    OT Diagnosis: Generalized weakness  OT Problem List: Decreased strength;Decreased activity tolerance;Decreased knowledge of use of DME or AE;Pain OT Treatment Interventions: Self-care/ADL training;DME and/or AE instruction;Patient/family education   OT Goals Acute Rehab OT Goals OT Goal Formulation: With patient Time For Goal Achievement: 04/04/12 Potential to Achieve Goals: Good ADL Goals Pt Will Perform Grooming: with supervision;Standing at sink;Supported ADL Goal: Grooming - Progress: Goal set today Pt Will Perform Lower Body Bathing: with min assist;Sit to stand from bed;with adaptive equipment (min guard) ADL Goal: Lower Body Bathing - Progress: Goal set today Pt Will Transfer to Toilet: with min assist;Anterior-posterior transfer;3-in-1 (min guard) ADL Goal: Toilet Transfer - Progress: Goal set today Pt Will Perform Toileting - Hygiene: with min assist;Sit to stand from 3-in-1/toilet (min guard) ADL Goal: Toileting - Hygiene - Progress: Goal set today  Visit Information  Last OT Received On: 03/28/12 Assistance Needed: +1    Subjective Data  Subjective: Lavenia Atlas been legally blind for 12 years  Patient Stated Goal: rehab then home   Prior Functioning  Vision/Perception  Home Living Lives With: Spouse Prior Function Level of Independence: Independent Driving: No Communication Communication: Other (comment) (a little HOH) Dominant Hand: Right      Cognition  Overall Cognitive Status: Appears within functional limits for tasks assessed/performed Arousal/Alertness: Awake/alert Orientation Level: Appears intact for tasks assessed Behavior During Session: [facility] for tasks  performed    Extremity/Trunk Assessment Right Upper Extremity Assessment RUE ROM/Strength/Tone: [facility] for  tasks assessed Left Upper Extremity Assessment LUE ROM/Strength/Tone: [facility] for tasks assessed   Mobility  Shoulder Instructions  Bed Mobility Supine to Sit: 4: Min assist Transfers Sit to Stand: 4: Min assist Details for Transfer Assistance: cues hand placement       Exercise     Balance     End of Session OT - End of Session Activity Tolerance: Patient tolerated treatment well Patient left: in chair;with call bell/phone within reach CPM Left Knee CPM Left Knee: Off  GO     Darlina Mccaughey 03/28/2012, 9:24 AM Marica Otter, OTR/L [phone] 03/28/2012

## 2012-03-28 NOTE — Progress Notes (Signed)
Physical Therapy Treatment Patient Details Name: Paul Gaines MRN: [id number] DOB: [birth year] Today's Date: 03/28/2012 Time: 1010-1045 PT Time Calculation (min): 35 min  PT Assessment / Plan / Recommendation Comments on Treatment Session  POD # 2 am session.  Performed TKR TE's while supine in recliner then amb pt in hallway.    Follow Up Recommendations  Skilled nursing facility    Barriers to Discharge        Equipment Recommendations  Defer to next venue    Recommendations for Other Services    Frequency 7X/week   Plan Discharge plan remains appropriate    Precautions / Restrictions Precautions Precautions: None Precaution Comments: Pt instructed on KI use for amb.  Pt legally blind Required Braces or Orthoses: Knee Immobilizer - Left Knee Immobilizer - Left: Discontinue once straight leg raise with < 10 degree lag Restrictions Weight Bearing Restrictions: No Other Position/Activity Restrictions: WBAT    Pertinent Vitals/Pain C/o 6/10 during TE's ICE applied    Mobility  Bed Mobility Bed Mobility: Not assessed Supine to Sit: 4: Min assist Details for Bed Mobility Assistance: Pt OOB in recliner  Transfers Transfers: Sit to Stand;Stand to Sit Sit to Stand: 4: Min guard;4: Min assist;From chair/3-in-1 Stand to Sit: 4: Min guard;4: Min assist;To chair/3-in-1 Details for Transfer Assistance: <25% VC's on safety with turns and hand placement with stand to sit.  Ambulation/Gait Ambulation/Gait Assistance: 4: Min guard;4: Min Environmental consultant (Feet): 175 Feet Assistive device: Rolling walker Ambulation/Gait Assistance Details: 25% VC's on proper walker to self distance and upright posture Gait Pattern: Step-to pattern;Decreased stance time - left;Trunk flexed Gait velocity: decreased    Exercises Total Joint Exercises Ankle Circles/Pumps: AROM;10 reps;Supine;Both Quad Sets: AROM;10 reps;Supine;Both Gluteal Sets: AROM;10 reps;Supine;Both Towel Squeeze:  AROM;10 reps;Supine;Both Heel Slides: AAROM;Left;10 reps;Supine Hip ABduction/ADduction: AAROM;Left;10 reps;Supine Straight Leg Raises: AAROM;Left;10 reps;Supine    PT Goals                     progressing    Visit Information  Last PT Received On: 03/28/12 Assistance Needed: +1    Subjective Data  Subjective: I feel more sore today Patient Stated Goal: Rehab then home   Cognition  Overall Cognitive Status: Appears within functional limits for tasks assessed/performed Arousal/Alertness: Awake/alert Orientation Level: Appears intact for tasks assessed Behavior During Session: [facility] for tasks performed    Balance   fair+  End of Session PT - End of Session Equipment Utilized During Treatment: Gait belt;Left knee immobilizer Activity Tolerance: Patient tolerated treatment well Patient left: in chair;with call bell/phone within reach Nurse Communication: Mobility status   [name]  PTA [facility]  Acute  Rehab Pager     [phone]

## 2012-03-28 NOTE — Progress Notes (Signed)
   Subjective: 2 Days Post-Op Procedure(s) (LRB): TOTAL KNEE ARTHROPLASTY (Left) Patient reports pain as mild.   Patient seen in rounds with Dr. Lequita Halt. Patient is well, and has had no acute complaints or problems Plan is to go Skilled nursing facility after hospital stay.  Objective: Vital signs in last 24 hours: Temp:  [98.2 F (36.8 C)-99.1 F (37.3 C)] 98.4 F (36.9 C) (09/11 0556) Pulse Rate:  [70-90] 74  (09/11 0556) Resp:  [14-15] 14  (09/11 0556) BP: (111-147)/(65-85) 128/73 mmHg (09/11 0556) SpO2:  [94 %-97 %] 94 % (09/11 0556)  Intake/Output from previous day:  Intake/Output Summary (Last 24 hours) at 03/28/12 0904 Last data filed at 03/28/12 0754  Gross per 24 hour  Intake    480 ml  Output    850 ml  Net   -370 ml    Intake/Output this shift: Total I/O In: -  Out: 200 [Urine:200]  Labs:  [facility] 03/28/12 0424 03/27/12 0403  HGB 9.3* 10.4*    Basename 03/28/12 0424 03/27/12 0403  WBC 11.9* 10.8*  RBC 3.05* 3.42*  HCT 27.0* 30.7*  PLT 102* 110*    Basename 03/28/12 0424 03/27/12 0403  NA 133* 136  K 4.4 4.6  CL 99 102  CO2 29 28  BUN 20 18  CREATININE 1.29 1.21  GLUCOSE 152* 145*  CALCIUM 8.5 8.4   No results found for this basename: LABPT:2,INR:2 in the last 72 hours  EXAM General - Patient is Alert, Appropriate and Oriented Extremity - Neurovascular intact Sensation intact distally Dorsiflexion/Plantar flexion intact No cellulitis present Dressing/Incision - clean, dry, no drainage, healing Motor Function - intact, moving foot and toes well on exam.   Past Medical History  Diagnosis Date  . Myocardial infarction   . Coronary artery disease     stent - 2001   . Hypertension   . Asthma   . Shortness of breath     with exertion   . Pneumonia     hx of several times per pt   . Chronic kidney disease     frequency  . Arthritis   . Cancer     hx of skin cancer on nose  . Legally blind     Assessment/Plan: 2 Days Post-Op  Procedure(s) (LRB): TOTAL KNEE ARTHROPLASTY (Left) Principal Problem:  *OA (osteoarthritis) of knee Active Problems:  Postop Acute blood loss anemia  Postop Hyponatremia  Recheck labs in am  Up with therapy Plan for discharge tomorrow Discharge to SNF - Looking into [facility]  DVT Prophylaxis - Xarelto Weight-Bearing as tolerated to left leg  Chiante Peden 03/28/2012, 9:04 AM

## 2012-03-29 LAB — BASIC METABOLIC PANEL
CO2: 26 mEq/L (ref 19–32)
Calcium: 8.4 mg/dL (ref 8.4–10.5)
GFR calc Af Amer: 58 mL/min — ABNORMAL LOW (ref >90)
GFR calc non Af Amer: 50 mL/min — ABNORMAL LOW (ref >90)
Sodium: 134 mEq/L — ABNORMAL LOW (ref 135–145)

## 2012-03-29 LAB — CBC
MCH: 30.7 pg (ref 26.0–34.0)
Platelets: 94 10*3/uL — ABNORMAL LOW (ref 150–400)
RBC: 2.57 MIL/uL — ABNORMAL LOW (ref 4.22–5.81)

## 2012-03-29 MED ORDER — METHOCARBAMOL 500 MG PO TABS: 500.0000 mg | ORAL_TABLET | Freq: Four times a day (QID) | ORAL | Status: AC | PRN

## 2012-03-29 MED ORDER — RIVAROXABAN 10 MG PO TABS: 10.0000 mg | ORAL_TABLET | Freq: Every day | ORAL | Status: DC

## 2012-03-29 MED ORDER — DSS 100 MG PO CAPS: 100.0000 mg | ORAL_CAPSULE | Freq: Two times a day (BID) | ORAL | Status: AC

## 2012-03-29 MED ORDER — ALUM & MAG HYDROXIDE-SIMETH 200-200-20 MG/5ML PO SUSP: 30.0000 mL | ORAL | Status: DC | PRN

## 2012-03-29 MED ORDER — POLYSACCHARIDE IRON COMPLEX 150 MG PO CAPS: 150.0000 mg | ORAL_CAPSULE | Freq: Two times a day (BID) | ORAL | Status: DC

## 2012-03-29 MED ORDER — POLYETHYLENE GLYCOL 3350 17 G PO PACK: 17.0000 g | PACK | Freq: Every day | ORAL | Status: AC | PRN

## 2012-03-29 MED ORDER — ONDANSETRON HCL 4 MG PO TABS: 4.0000 mg | ORAL_TABLET | Freq: Four times a day (QID) | ORAL | Status: AC | PRN

## 2012-03-29 MED ORDER — ACETAMINOPHEN 10 MG/ML IV SOLN
1000.0000 mg | Freq: Once | INTRAVENOUS | Status: AC
  Administered 2012-03-29: 1000 mg via INTRAVENOUS
  Filled 2012-03-29: qty 100

## 2012-03-29 MED ORDER — OXYCODONE HCL 5 MG PO TABS: 5.0000 mg | ORAL_TABLET | ORAL | Status: AC | PRN

## 2012-03-29 MED ORDER — ACETAMINOPHEN 325 MG PO TABS: 650.0000 mg | ORAL_TABLET | Freq: Four times a day (QID) | ORAL | Status: AC | PRN

## 2012-03-29 MED ORDER — BISACODYL 10 MG RE SUPP
10.0000 mg | Freq: Once | RECTAL | Status: AC
  Administered 2012-03-29: 10 mg via RECTAL

## 2012-03-29 MED ORDER — BISACODYL 10 MG RE SUPP: 10.0000 mg | Freq: Every day | RECTAL | Status: AC | PRN

## 2012-03-29 NOTE — Progress Notes (Signed)
Utilization review completed.  

## 2012-03-29 NOTE — Progress Notes (Signed)
   Subjective: 3 Days Post-Op Procedure(s) (LRB): TOTAL KNEE ARTHROPLASTY (Left) Patient reports pain as mild.   Patient seen in rounds with Dr. Lequita Halt. HGB is down but is asymptomatic at time of rounds.  Will see how he does with therapy.  he becomes symptomatic, then blood today.  If not, then will transfer over later today.  He also has some GI complaints this morning.  Only had a slight BM yesterday.  Will try suppository this morning and if improved, then transfer over.  If no relief, will try enema next.  If gets relief and no symptoms, then transfer. Otherwise, may need another day.  Patient is having problems with constipation.  Try supp. Patient may be ready to go to SNF - [facility] later today.  Will see how he does this morning.   Objective: Vital signs in last 24 hours: Temp:  [98.1 F (36.7 C)-98.6 F (37 C)] 98.6 F (37 C) (09/12 0540) Pulse Rate:  [76-95] 76  (09/12 0540) Resp:  [14-17] 16  (09/12 0540) BP: (105-142)/(56-75) 105/56 mmHg (09/12 0540) SpO2:  [91 %-93 %] 91 % (09/12 0540)  Intake/Output from previous day:  Intake/Output Summary (Last 24 hours) at 03/29/12 0831 Last data filed at 03/29/12 0815  Gross per 24 hour  Intake    900 ml  Output   1525 ml  Net   -625 ml    Intake/Output this shift: Total I/O In: 120 [P.O.:120] Out: 500 [Urine:500]  Labs:  [facility] 03/29/12 0405 03/28/12 0424 03/27/12 0403  HGB 7.9* 9.3* 10.4*    Basename 03/29/12 0405 03/28/12 0424  WBC 9.9 11.9*  RBC 2.57* 3.05*  HCT 22.6* 27.0*  PLT 94* 102*    Basename 03/29/12 0405 03/28/12 0424  NA 134* 133*  K 3.8 4.4  CL 99 99  CO2 26 29  BUN 23 20  CREATININE 1.32 1.29  GLUCOSE 140* 152*  CALCIUM 8.4 8.5   No results found for this basename: LABPT:2,INR:2 in the last 72 hours  EXAM: General - Patient is Alert, Appropriate and Oriented Extremity - Neurovascular intact Sensation intact distally Dorsiflexion/Plantar flexion intact No cellulitis  present Incision - clean, dry, no drainage, healing Motor Function - intact, moving foot and toes well on exam.   Assessment/Plan: 3 Days Post-Op Procedure(s) (LRB): TOTAL KNEE ARTHROPLASTY (Left) Procedure(s) (LRB): TOTAL KNEE ARTHROPLASTY (Left) Past Medical History  Diagnosis Date  . Myocardial infarction   . Coronary artery disease     stent - 2001   . Hypertension   . Asthma   . Shortness of breath     with exertion   . Pneumonia     hx of several times per pt   . Chronic kidney disease     frequency  . Arthritis   . Cancer     hx of skin cancer on nose  . Legally blind    Principal Problem:  *OA (osteoarthritis) of knee Active Problems:  Postop Acute blood loss anemia  Postop Hyponatremia   Discharge to SNF Diet - Cardiac diet Follow up - in 2 weeks Activity - WBAT Disposition - Skilled nursing facility Condition Upon Discharge - Stable D/C Meds - See DC Summary DVT Prophylaxis - Xarelto  Draden Cottingham 03/29/2012, 8:31 AM

## 2012-03-29 NOTE — Progress Notes (Signed)
Physical Therapy Treatment Patient Details Name: Paul Gaines MRN: [id number] DOB: [birth year] Today's Date: 03/29/2012 Time: 1425-1500 PT Time Calculation (min): 35 min  PT Assessment / Plan / Recommendation Comments on Treatment Session  POD #3 pm session.  Pt currently in bed receiving lood.  Performed TKR TE's and applied ICE.    Follow Up Recommendations  Skilled nursing facility    Barriers to Discharge        Equipment Recommendations  Defer to next venue    Recommendations for Other Services    Frequency 7X/week   Plan Discharge plan remains appropriate    Precautions / Restrictions Precautions Precautions: Knee Precaution Comments: Pt instructed on KI use for amb Required Braces or Orthoses: Knee Immobilizer - Left Knee Immobilizer - Left: Discontinue once straight leg raise with < 10 degree lag Restrictions Weight Bearing Restrictions: No Other Position/Activity Restrictions: WBAT    Pertinent Vitals/Pain C/o 8/10 knee pain during TE's Applied ICE Pre medicated       Exercises Total Joint Exercises Ankle Circles/Pumps: AROM;Both;Supine;10 reps Quad Sets: AROM;Both;10 reps;Supine Gluteal Sets: AROM;Both;10 reps;Supine Towel Squeeze: AROM;Both;10 reps;Supine Short Arc Quad: AAROM;Left;10 reps;Supine Heel Slides: AAROM;Left;10 reps;Supine Hip ABduction/ADduction: AAROM;Left;10 reps;Supine Straight Leg Raises: AAROM;Left;10 reps;Supine    PT Goals progressing    Visit Information  Last PT Received On: 03/29/12 Assistance Needed: +1    Subjective Data  Subjective: I feel weak Patient Stated Goal: Rehab then home             End of Session Pt in bed with family in room    [name]  PTA [facility]  Acute  Rehab Pager     [phone]

## 2012-03-29 NOTE — Progress Notes (Signed)
D/C on hold due to medical status. [employer] / SNF updated. [facility] will have rehab bed available tomorrow if pt is stable for d/c.   Cori Razor LCSW [phone]

## 2012-03-29 NOTE — Progress Notes (Signed)
Pt has ST SNF bed available at [facility] today if stable for d/c. CSW will follow to assist with d/c planning to SNF.  Cori Razor LCSW [phone]

## 2012-03-29 NOTE — Progress Notes (Signed)
Physical Therapy Treatment Patient Details Name: Paul Gaines MRN: [id number] DOB: Oct 08, 1932 Today's Date: 03/29/2012 Time: 0900-0930 PT Time Calculation (min): 30 min  PT Assessment / Plan / Recommendation Comments on Treatment Session  POD #3 am session.  Pt reports he feels weak.  Hgb 7.9      BP supine 125/67, EOB 99/61, standing 115/64 then after amb 15' BP 90/54 with mild c/o feeling "weak".Reported to PA.    Follow Up Recommendations  Skilled nursing facility    Barriers to Discharge        Equipment Recommendations  Defer to next venue    Recommendations for Other Services    Frequency 7X/week   Plan Discharge plan remains appropriate    Precautions / Restrictions Precautions Precautions: Knee Precaution Comments: Pt instructed on KI use for amb Required Braces or Orthoses: Knee Immobilizer - Left Knee Immobilizer - Left: Discontinue once straight leg raise with < 10 degree lag Restrictions Weight Bearing Restrictions: No Other Position/Activity Restrictions: WBAT    Pertinent Vitals/Pain C/o 3/10 knee pain    Mobility  Bed Mobility Bed Mobility: Supine to Sit Supine to Sit: 4: Min assist Details for Bed Mobility Assistance: Min assist to support L LE off bed and increased time  Transfers Transfers: Sit to Stand;Stand to Sit Sit to Stand: 4: Min assist;From bed Stand to Sit: To chair/3-in-1;4: Min assist Details for Transfer Assistance: increased assistance 2nd MAX c/o fatigue.  "I feel weak". <25% VC's on safety with turns use of RW throughout turn.  Ambulation/Gait Ambulation/Gait Assistance: 4: Min assist Ambulation Distance (Feet): 15 Feet Assistive device: Rolling walker Ambulation/Gait Assistance Details: decreased amb distance 2nd increased c/o fatigue and noted mild SOB. Gait Pattern: Step-to pattern;Decreased stance time - left Gait velocity: decreased     PT Goals                        progressing    Visit Information  Last PT Received  On: 03/29/12 Assistance Needed: +1    Subjective Data  Subjective: I feel weak Patient Stated Goal: Rehab then home             End of Session PT - End of Session Equipment Utilized During Treatment: Gait belt;Left knee immobilizer Activity Tolerance: Patient limited by fatigue Patient left: in chair;with call bell/phone within reach   [name]  PTA [facility]  Acute  Rehab Pager     [phone]

## 2012-03-29 NOTE — Discharge Summary (Signed)
Physician Discharge Summary   Patient ID: Paul Gaines MRN: [id number] DOB/AGE: Apr 23, 1933 78 y.o.  Admit date: 03/26/2012 Discharge date: 03/29/2012  Primary Diagnosis: Osteoarthritis Left knee   Admission Diagnoses:  Past Medical History  Diagnosis Date  . Myocardial infarction   . Coronary artery disease     stent - 2001   . Hypertension   . Asthma   . Shortness of breath     with exertion   . Pneumonia     hx of several times per pt   . Chronic kidney disease     frequency  . Arthritis   . Cancer     hx of skin cancer on nose  . Legally blind    Discharge Diagnoses:   Principal Problem:  *OA (osteoarthritis) of knee Active Problems:  Postop Acute blood loss anemia  Postop Hyponatremia  Procedure:  Procedure(s) (LRB): TOTAL KNEE ARTHROPLASTY (Left)   Consults: None  HPI: Paul Gaines is a 78 y.o. year old male with end stage OA of his left knee with progressively worsening pain and dysfunction. He has constant pain, with activity and at rest and significant functional deficits with difficulties even with ADLs. He has had extensive non-op management including analgesics, injections of cortisone and viscosupplements, and home exercise program, but remains in significant pain with significant dysfunction. Radiographs show bone on bone arthritis all 3 compartments. He presents now for left Total Knee Arthroplasty.       Laboratory Data: Hospital Outpatient Visit on 03/21/2012  Component Date Value Range Status  . aPTT 03/21/2012 31  24 - 37 seconds Final  . WBC 03/21/2012 7.7  4.0 - 10.5 K/uL Final  . RBC 03/21/2012 4.36  4.22 - 5.81 MIL/uL Final  . Hemoglobin 03/21/2012 13.6  13.0 - 17.0 g/dL Final  . HCT 40/98/1191 39.5  39.0 - 52.0 % Final  . MCV 03/21/2012 90.6  78.0 - 100.0 fL Final  . MCH 03/21/2012 31.2  26.0 - 34.0 pg Final  . MCHC 03/21/2012 34.4  30.0 - 36.0 g/dL Final  . RDW 47/82/9562 14.2  11.5 - 15.5 % Final  . Platelets 03/21/2012 165  150 -  400 K/uL Final  . Sodium 03/21/2012 141  135 - 145 mEq/L Final  . Potassium 03/21/2012 4.2  3.5 - 5.1 mEq/L Final  . Chloride 03/21/2012 106  96 - 112 mEq/L Final  . CO2 03/21/2012 28  19 - 32 mEq/L Final  . Glucose, Bld 03/21/2012 102* 70 - 99 mg/dL Final  . BUN 13/02/6577 22  6 - 23 mg/dL Final  . Creatinine, Ser 03/21/2012 1.19  0.50 - 1.35 mg/dL Final  . Calcium 46/96/2952 9.3  8.4 - 10.5 mg/dL Final  . Total Protein 03/21/2012 6.6  6.0 - 8.3 g/dL Final  . Albumin 84/13/2440 4.1  3.5 - 5.2 g/dL Final  . AST 05/14/2535 13  0 - 37 U/L Final  . ALT 03/21/2012 11  0 - 53 U/L Final  . Alkaline Phosphatase 03/21/2012 44  39 - 117 U/L Final  . Total Bilirubin 03/21/2012 0.4  0.3 - 1.2 mg/dL Final  . GFR calc non Af Amer 03/21/2012 57* >90 mL/min Final  . GFR calc Af Amer 03/21/2012 66* >90 mL/min Final   Comment:                                 The eGFR has been calculated  using the CKD EPI equation.                          This calculation has not been                          validated in all clinical                          situations.                          eGFR's persistently                          <90 mL/min signify                          possible Chronic Kidney Disease.  Marland Kitchen Prothrombin Time 03/21/2012 14.9  11.6 - 15.2 seconds Final  . INR 03/21/2012 1.15  0.00 - 1.49 Final  . Color, Urine 03/21/2012 YELLOW  YELLOW Final  . APPearance 03/21/2012 CLEAR  CLEAR Final  . Specific Gravity, Urine 03/21/2012 1.027  1.005 - 1.030 Final  . pH 03/21/2012 6.0  5.0 - 8.0 Final  . Glucose, UA 03/21/2012 NEGATIVE  NEGATIVE mg/dL Final  . Hgb urine dipstick 03/21/2012 NEGATIVE  NEGATIVE Final  . Bilirubin Urine 03/21/2012 NEGATIVE  NEGATIVE Final  . Ketones, ur 03/21/2012 NEGATIVE  NEGATIVE mg/dL Final  . Protein, ur 16/04/9603 NEGATIVE  NEGATIVE mg/dL Final  . Urobilinogen, UA 03/21/2012 1.0  0.0 - 1.0 mg/dL Final  . Nitrite 54/03/8118 NEGATIVE  NEGATIVE  Final  . Leukocytes, UA 03/21/2012 NEGATIVE  NEGATIVE Final   MICROSCOPIC NOT DONE ON URINES WITH NEGATIVE PROTEIN, BLOOD, LEUKOCYTES, NITRITE, OR GLUCOSE <1000 mg/dL.  Marland Kitchen MRSA, PCR 03/21/2012 NEGATIVE  NEGATIVE Final  . Staphylococcus aureus 03/21/2012 NEGATIVE  NEGATIVE Final   Comment:                                 The Xpert SA Assay (FDA                          approved for NASAL specimens                          in patients over 73 years of age),                          is one component of                          a comprehensive surveillance                          program.  Test performance has                          been validated by [employer] for patients greater  than or equal to 66 year old.                          It is not intended                          to diagnose infection nor to                          guide or monitor treatment.    Basename 03/29/12 0405 03/28/12 0424 03/27/12 0403  HGB 7.9* 9.3* 10.4*    Basename 03/29/12 0405 03/28/12 0424  WBC 9.9 11.9*  RBC 2.57* 3.05*  HCT 22.6* 27.0*  PLT 94* 102*    Basename 03/29/12 0405 03/28/12 0424  NA 134* 133*  K 3.8 4.4  CL 99 99  CO2 26 29  BUN 23 20  CREATININE 1.32 1.29  GLUCOSE 140* 152*  CALCIUM 8.4 8.5   No results found for this basename: LABPT:2,INR:2 in the last 72 hours  X-Rays:No results found.  EKG: Orders placed during the hospital encounter of 03/21/12  . EKG 12-LEAD  . EKG 12-LEAD     Hospital Course: Patient was admitted to [facility] and taken to the OR and underwent the above state procedure without complications.  Patient tolerated the procedure well and was later transferred to the recovery room and then to the orthopaedic floor for postoperative care.  They were given PO and IV analgesics for pain control following their surgery.  They were given 24 hours of postoperative antibiotics and started on DVT  prophylaxis in the form of Xarelto.   PT and OT were ordered for total joint protocol.  Discharge planning consulted to help with postop disposition and equipment needs.  Patient had a decent night on the evening of surgery and started to get up OOB with therapy on day one.  PCA Morphine was discontinued and they were weaned over to PO meds.  Hemovac drain was pulled without difficulty.  Continued to work with therapy into day two.  Dressing was changed on day two and the incision was healing well.  By day three, patient was seen in rounds with Dr. Lequita Halt. HGB was down to 7.9 but is asymptomatic at time of rounds. Will see how he did with therapy. If he became symptomatic, then blood today. If not, then will transfer over later today. He also had some GI complaints this morning. Only had a slight BM yesterday. Will try suppository this morning and if improved, then transfer over. If no relief, will try enema next. If gets relief and no symptoms, then transfer. Otherwise, he may need another day. Depending upon his progress today, he may be ready to go to SNF - [facility]	[place] later today.   Discharge Medications: Prior to Admission medications   Medication Sig Start Date End Date Taking? Authorizing Provider  amLODipine (NORVASC) 5 MG tablet Take 10 mg by mouth every morning.   Yes Historical Provider, MD  aspirin EC 81 MG tablet Take 81 mg by mouth daily.   Yes Historical Provider, MD  carvedilol (COREG) 25 MG tablet Take 25 mg by mouth 2 (two) times daily with a meal.   Yes Historical Provider, MD  doxazosin (CARDURA) 4 MG tablet Take 4 mg by mouth at bedtime.   Yes Historical Provider, MD  pravastatin (PRAVACHOL) 20 MG tablet Take 20 mg by mouth  2 (two) times daily.    Yes Historical Provider, MD  prednisoLONE acetate (PRED FORTE) 1 % ophthalmic suspension Place 1 drop into the right eye every morning.   Yes Historical Provider, MD  Psyllium (METAMUCIL PO) Take 1 tablet by mouth every morning.   Yes  Historical Provider, MD  timolol (BETIMOL) 0.5 % ophthalmic solution Place 1 drop into both eyes every morning.   Yes Historical Provider, MD  trospium (SANCTURA) 20 MG tablet Take 20 mg by mouth at bedtime.   Yes Historical Provider, MD  acetaminophen (TYLENOL) 325 MG tablet Take 2 tablets (650 mg total) by mouth every 6 (six) hours as needed (or Fever >/= 101). 03/29/12 03/29/13  Jeremy Ditullio, PA  bisacodyl (DULCOLAX) 10 MG suppository Place 1 suppository (10 mg total) rectally daily as needed. 03/29/12 04/08/12  Harinder Romas, PA  docusate sodium 100 MG CAPS Take 100 mg by mouth 2 (two) times daily. 03/29/12 04/08/12  Zori Benbrook, PA  iron polysaccharides (NIFEREX) 150 MG capsule Take 1 capsule (150 mg total) by mouth 2 (two) times daily. 03/29/12 03/29/13  Tommye Lehenbauer, PA  methocarbamol (ROBAXIN) 500 MG tablet Take 1 tablet (500 mg total) by mouth every 6 (six) hours as needed. 03/29/12 04/08/12  Rheannon Cerney, PA  ondansetron (ZOFRAN) 4 MG tablet Take 1 tablet (4 mg total) by mouth every 6 (six) hours as needed for nausea. 03/29/12 04/05/12  Kshawn Canal, PA  oxyCODONE (OXY IR/ROXICODONE) 5 MG immediate release tablet Take 1-2 tablets (5-10 mg total) by mouth every 4 (four) hours as needed for pain. 03/29/12 04/08/12  Joyann Spidle, PA  polyethylene glycol (MIRALAX / GLYCOLAX) packet Take 17 g by mouth daily as needed. 03/29/12 04/01/12  Deagan Sevin Julien Girt, PA  rivaroxaban (XARELTO) 10 MG TABS tablet Take 1 tablet (10 mg total) by mouth daily with breakfast. Take Xarelto for two and a half more weeks, then discontinue Xarelto.  03/29/12   Deardra Hinkley Julien Girt, PA    Diet: Cardiac diet Activity:WBAT Follow-up:in 2 weeks Disposition - Skilled nursing facility - [facility] Discharged Condition: stable   Discharge Orders    Future Orders Please Complete By Expires   Diet - low sodium heart healthy      Call MD / Call 911      Comments:   If you  experience chest pain or shortness of breath, CALL 911 and be transported to the hospital emergency room.  If you develope a fever above 101 F, pus (white drainage) or increased drainage or redness at the wound, or calf pain, call your surgeon's office.   Discharge instructions      Comments:   Pick up stool softner and laxative for home. Do not submerge incision under water. May shower. Continue to use ice for pain and swelling from surgery.  Take Xarelto for two and a half more weeks, then discontinue Xarelto.  When discharged from the skilled rehab facility, please have the facility set up the patient's Home Health Physical Therapy prior to being released.  Also provide the patient with their medications at time of release from the facility to include their pain medication, the muscle relaxants, and their blood thinner medication.  If the patient is still at the rehab facility at time of follow up appointment, please also assist the patient in arranging follow up appointment in our office and any transportation needs.   Constipation Prevention      Comments:   Drink plenty of fluids.  Prune juice may be helpful.  You may use a stool softener, such as Colace (over the counter) 100 mg twice a day.  Use MiraLax (over the counter) for constipation as needed.   Increase activity slowly as tolerated      Patient may shower      Comments:   You may shower without a dressing once there is no drainage.  Do not wash over the wound.  If drainage remains, do not shower until drainage stops.   Driving restrictions      Comments:   No driving until released by the physician.   Lifting restrictions      Comments:   No lifting until released by the physician.   TED hose      Comments:   Use stockings (TED hose) for 3 weeks on both leg(s).  You may remove them at night for sleeping.   Change dressing      Comments:   Change dressing daily with sterile 4 x 4 inch gauze dressing and apply TED hose. Do  not submerge the incision under water.   Do not put a pillow under the knee. Place it under the heel.      Do not sit on low chairs, stoools or toilet seats, as it may be difficult to get up from low surfaces          Medication List     As of 03/29/2012  8:52 AM    STOP taking these medications         multivitamin with minerals Tabs      naproxen 500 MG tablet   Commonly known as: NAPROSYN      PROBIOTIC DAILY PO      TAKE these medications         acetaminophen 325 MG tablet   Commonly known as: TYLENOL   Take 2 tablets (650 mg total) by mouth every 6 (six) hours as needed (or Fever >/= 101).      amLODipine 5 MG tablet   Commonly known as: NORVASC   Take 10 mg by mouth every morning.      aspirin EC 81 MG tablet   Take 81 mg by mouth daily.      bisacodyl 10 MG suppository   Commonly known as: DULCOLAX   Place 1 suppository (10 mg total) rectally daily as needed.      carvedilol 25 MG tablet   Commonly known as: COREG   Take 25 mg by mouth 2 (two) times daily with a meal.      doxazosin 4 MG tablet   Commonly known as: CARDURA   Take 4 mg by mouth at bedtime.      DSS 100 MG Caps   Take 100 mg by mouth 2 (two) times daily.      iron polysaccharides 150 MG capsule   Commonly known as: NIFEREX   Take 1 capsule (150 mg total) by mouth 2 (two) times daily.      METAMUCIL PO   Take 1 tablet by mouth every morning.      methocarbamol 500 MG tablet   Commonly known as: ROBAXIN   Take 1 tablet (500 mg total) by mouth every 6 (six) hours as needed.      ondansetron 4 MG tablet   Commonly known as: ZOFRAN   Take 1 tablet (4 mg total) by mouth every 6 (six) hours as needed for nausea.      oxyCODONE 5 MG immediate release tablet   Commonly known as:  Oxy IR/ROXICODONE   Take 1-2 tablets (5-10 mg total) by mouth every 4 (four) hours as needed for pain.      polyethylene glycol packet   Commonly known as: MIRALAX / GLYCOLAX   Take 17 g by mouth daily as  needed.      pravastatin 20 MG tablet   Commonly known as: PRAVACHOL   Take 20 mg by mouth 2 (two) times daily.      prednisoLONE acetate 1 % ophthalmic suspension   Commonly known as: PRED FORTE   Place 1 drop into the right eye every morning.      rivaroxaban 10 MG Tabs tablet   Commonly known as: XARELTO   Take 1 tablet (10 mg total) by mouth daily with breakfast.      timolol 0.5 % ophthalmic solution   Commonly known as: BETIMOL   Place 1 drop into both eyes every morning.      trospium 20 MG tablet   Commonly known as: SANCTURA   Take 20 mg by mouth at bedtime.           Follow-up Information    Follow up with Loanne Drilling, MD. Schedule an appointment as soon as possible for a visit in 2 weeks.   Contact information:   [facility] [address] [place] [address] [phone]          Signed: Patrica Duel 03/29/2012, 8:52 AM

## 2012-03-30 ENCOUNTER — Encounter: Payer: Self-pay | Admitting: Internal Medicine

## 2012-03-30 LAB — CBC
MCH: 30.8 pg (ref 26.0–34.0)
MCHC: 35.5 g/dL (ref 30.0–36.0)
MCV: 86.7 fL (ref 78.0–100.0)
Platelets: 137 10*3/uL — ABNORMAL LOW (ref 150–400)
RBC: 2.79 MIL/uL — ABNORMAL LOW (ref 4.22–5.81)

## 2012-03-30 LAB — BASIC METABOLIC PANEL
BUN: 23 mg/dL (ref 6–23)
CO2: 27 mEq/L (ref 19–32)
Calcium: 8.3 mg/dL — ABNORMAL LOW (ref 8.4–10.5)
GFR calc non Af Amer: 54 mL/min — ABNORMAL LOW (ref >90)
Glucose, Bld: 144 mg/dL — ABNORMAL HIGH (ref 70–99)

## 2012-03-30 LAB — TYPE AND SCREEN
ABO/RH(D): O NEG
Antibody Screen: NEGATIVE
Unit division: 0

## 2012-03-30 MED ORDER — ALUM & MAG HYDROXIDE-SIMETH 200-200-20 MG/5ML PO SUSP: 30.0000 mL | ORAL | Status: AC | PRN

## 2012-03-30 NOTE — Progress Notes (Signed)
Pt for d/c to SNF-[facility] today. IV d/c'd. Dressing CDI to L knee. Wife to transport pt to SNF. PTA assist pt to car transport.

## 2012-03-30 NOTE — Progress Notes (Signed)
Physical Therapy Treatment Patient Details Name: Paul Gaines MRN: [id number] DOB: 1933-06-21 Today's Date: 03/30/2012 Time: 4098-1191 PT Time Calculation (min): 15 min  PT Assessment / Plan / Recommendation Comments on Treatment Session  POD #4 am session.  Pt states he feels alot better.  Assisted pt OOB to wc then downstairs to car.  Instructed spouse and pt on KI use for amb and removed it to allow more room to fit in car for transport to Rehab.    Follow Up Recommendations  Skilled nursing facility    Barriers to Discharge        Equipment Recommendations  Defer to next venue    Recommendations for Other Services    Frequency     Plan Discharge plan remains appropriate    Precautions / Restrictions Precautions Precautions: Knee Precaution Comments: Pt instructed on KI use for amb Required Braces or Orthoses: Knee Immobilizer - Left Knee Immobilizer - Left: Discontinue once straight leg raise with < 10 degree lag Restrictions Weight Bearing Restrictions: No Other Position/Activity Restrictions: WBAT    Pertinent Vitals/Pain C/o "soreness'    Mobility  Bed Mobility Bed Mobility: Supine to Sit Sit to Supine: 4: Min assist Details for Bed Mobility Assistance: Min assist to support L LE off bed and increased time   Transfers Transfers: Sit to Stand;Stand to Sit Sit to Stand: 4: Min assist;From chair/3-in-1;From bed Stand to Sit: To chair/3-in-1;Other (comment) (car) Details for Transfer Assistance: 25% VC's on proper technique and increased time.  Also, assisted pt in car with spouse as she was taking him to SNF.  Ambulation/Gait Ambulation/Gait Assistance: 4: Min assist Ambulation Distance (Feet): 12 Feet Assistive device: Rolling walker Ambulation/Gait Assistance Details: amb from bed to wc then from wc to car Gait Pattern: Step-to pattern;Decreased stance time - left;Trunk flexed Gait velocity: decreased     PT Goals                      progressing     Visit Information  Last PT Received On: 03/30/12 Assistance Needed: +1                   End of Session PT - End of Session Equipment Utilized During Treatment: Gait belt Activity Tolerance: Patient tolerated treatment well Patient left: Other (comment) (car D/C to SNF today)  [name]  PTA [facility]  Acute  Rehab Pager     [phone]

## 2012-03-30 NOTE — Progress Notes (Signed)
Discharge summary sent to payer through [facility]  

## 2012-03-30 NOTE — Discharge Summary (Signed)
Addendum - Physician Discharge Summary   Patient ID: Paul Gaines MRN: [id number] DOB/AGE: [birth year] 78 y.o.  Admit date: 03/26/2012 Discharge date: New date of discharge - 03/30/2012  Primary Diagnosis: Osteoarthritis Left knee   Admission Diagnoses:  Past Medical History  Diagnosis Date  . Myocardial infarction   . Coronary artery disease     stent - 2001   . Hypertension   . Asthma   . Shortness of breath     with exertion   . Pneumonia     hx of several times per pt   . Chronic kidney disease     frequency  . Arthritis   . Cancer     hx of skin cancer on nose  . Legally blind    Discharge Diagnoses:   Principal Problem:  *OA (osteoarthritis) of knee Active Problems:  Postop Acute blood loss anemia  Postop Hyponatremia  Procedure:  Procedure(s) (LRB): TOTAL KNEE ARTHROPLASTY (Left)   Consults: None  HPI: Paul Gaines is a 78 y.o. year old male with end stage OA of his left knee with progressively worsening pain and dysfunction. He has constant pain, with activity and at rest and significant functional deficits with difficulties even with ADLs. He has had extensive non-op management including analgesics, injections of cortisone and viscosupplements, and home exercise program, but remains in significant pain with significant dysfunction. Radiographs show bone on bone arthritis all 3 compartments. He presents now for left Total Knee Arthroplasty.   Laboratory Data: Hospital Outpatient Visit on 03/21/2012  Component Date Value Range Status  . aPTT 03/21/2012 31  24 - 37 seconds Final  . WBC 03/21/2012 7.7  4.0 - 10.5 K/uL Final  . RBC 03/21/2012 4.36  4.22 - 5.81 MIL/uL Final  . Hemoglobin 03/21/2012 13.6  13.0 - 17.0 g/dL Final  . HCT 40/98/1191 39.5  39.0 - 52.0 % Final  . MCV 03/21/2012 90.6  78.0 - 100.0 fL Final  . MCH 03/21/2012 31.2  26.0 - 34.0 pg Final  . MCHC 03/21/2012 34.4  30.0 - 36.0 g/dL Final  . RDW 47/82/9562 14.2  11.5 - 15.5 % Final  .  Platelets 03/21/2012 165  150 - 400 K/uL Final  . Sodium 03/21/2012 141  135 - 145 mEq/L Final  . Potassium 03/21/2012 4.2  3.5 - 5.1 mEq/L Final  . Chloride 03/21/2012 106  96 - 112 mEq/L Final  . CO2 03/21/2012 28  19 - 32 mEq/L Final  . Glucose, Bld 03/21/2012 102* 70 - 99 mg/dL Final  . BUN 13/02/6577 22  6 - 23 mg/dL Final  . Creatinine, Ser 03/21/2012 1.19  0.50 - 1.35 mg/dL Final  . Calcium 46/96/2952 9.3  8.4 - 10.5 mg/dL Final  . Total Protein 03/21/2012 6.6  6.0 - 8.3 g/dL Final  . Albumin 84/13/2440 4.1  3.5 - 5.2 g/dL Final  . AST 05/14/2535 13  0 - 37 U/L Final  . ALT 03/21/2012 11  0 - 53 U/L Final  . Alkaline Phosphatase 03/21/2012 44  39 - 117 U/L Final  . Total Bilirubin 03/21/2012 0.4  0.3 - 1.2 mg/dL Final  . GFR calc non Af Amer 03/21/2012 57* >90 mL/min Final  . GFR calc Af Amer 03/21/2012 66* >90 mL/min Final   Comment:                                 The eGFR has been calculated  using the CKD EPI equation.                          This calculation has not been                          validated in all clinical                          situations.                          eGFR's persistently                          <90 mL/min signify                          possible Chronic Kidney Disease.  Marland Kitchen Prothrombin Time 03/21/2012 14.9  11.6 - 15.2 seconds Final  . INR 03/21/2012 1.15  0.00 - 1.49 Final  . Color, Urine 03/21/2012 YELLOW  YELLOW Final  . APPearance 03/21/2012 CLEAR  CLEAR Final  . Specific Gravity, Urine 03/21/2012 1.027  1.005 - 1.030 Final  . pH 03/21/2012 6.0  5.0 - 8.0 Final  . Glucose, UA 03/21/2012 NEGATIVE  NEGATIVE mg/dL Final  . Hgb urine dipstick 03/21/2012 NEGATIVE  NEGATIVE Final  . Bilirubin Urine 03/21/2012 NEGATIVE  NEGATIVE Final  . Ketones, ur 03/21/2012 NEGATIVE  NEGATIVE mg/dL Final  . Protein, ur 16/04/9603 NEGATIVE  NEGATIVE mg/dL Final  . Urobilinogen, UA 03/21/2012 1.0  0.0 - 1.0 mg/dL Final  . Nitrite  54/03/8118 NEGATIVE  NEGATIVE Final  . Leukocytes, UA 03/21/2012 NEGATIVE  NEGATIVE Final   MICROSCOPIC NOT DONE ON URINES WITH NEGATIVE PROTEIN, BLOOD, LEUKOCYTES, NITRITE, OR GLUCOSE <1000 mg/dL.  Marland Kitchen MRSA, PCR 03/21/2012 NEGATIVE  NEGATIVE Final  . Staphylococcus aureus 03/21/2012 NEGATIVE  NEGATIVE Final   Comment:                                 The Xpert SA Assay (FDA                          approved for NASAL specimens                          in patients over 90 years of age),                          is one component of                          a comprehensive surveillance                          program.  Test performance has                          been validated by [employer] for patients greater  than or equal to 34 year old.                          It is not intended                          to diagnose infection nor to                          guide or monitor treatment.    Basename 03/30/12 0357 03/29/12 0405 03/28/12 0424  HGB 8.6* 7.9* 9.3*    Basename 03/30/12 0357 03/29/12 0405  WBC 8.9 9.9  RBC 2.79* 2.57*  HCT 24.2* 22.6*  PLT 137* 94*    Basename 03/30/12 0357 03/29/12 0405  NA 133* 134*  K 4.0 3.8  CL 99 99  CO2 27 26  BUN 23 23  CREATININE 1.24 1.32  GLUCOSE 144* 140*  CALCIUM 8.3* 8.4   No results found for this basename: LABPT:2,INR:2 in the last 72 hours  X-Rays:No results found.  EKG: Orders placed during the hospital encounter of 03/21/12  . EKG 12-LEAD  . EKG 12-LEAD     Addendum Hospital Course: Please the original DC Summary for the first part of the hospital course. When the patient got up yesterday with therapy, he became orthostatic and weak.  He was kept and given blood.  He was also given bowel medications that helped move his bowels.  After the blood and the improvement in his bowel status, the patient was feeling much better on rounds on POD 4 and it was decided that the  patient could go to SNF today.  Discharge Medications: Prior to Admission medications   Medication Sig Start Date End Date Taking? Authorizing Provider  amLODipine (NORVASC) 5 MG tablet Take 10 mg by mouth every morning.   Yes Historical Provider, MD  aspirin EC 81 MG tablet Take 81 mg by mouth daily.   Yes Historical Provider, MD  carvedilol (COREG) 25 MG tablet Take 25 mg by mouth 2 (two) times daily with a meal.   Yes Historical Provider, MD  doxazosin (CARDURA) 4 MG tablet Take 4 mg by mouth at bedtime.   Yes Historical Provider, MD  pravastatin (PRAVACHOL) 20 MG tablet Take 20 mg by mouth 2 (two) times daily.    Yes Historical Provider, MD  prednisoLONE acetate (PRED FORTE) 1 % ophthalmic suspension Place 1 drop into the right eye every morning.   Yes Historical Provider, MD  Psyllium (METAMUCIL PO) Take 1 tablet by mouth every morning.   Yes Historical Provider, MD  timolol (BETIMOL) 0.5 % ophthalmic solution Place 1 drop into both eyes every morning.   Yes Historical Provider, MD  trospium (SANCTURA) 20 MG tablet Take 20 mg by mouth at bedtime.   Yes Historical Provider, MD  acetaminophen (TYLENOL) 325 MG tablet Take 2 tablets (650 mg total) by mouth every 6 (six) hours as needed (or Fever >/= 101). 03/29/12 03/29/13  Asanti Craigo, PA  alum & mag hydroxide-simeth (MAALOX/MYLANTA) 200-200-20 MG/5ML suspension Take 30 mLs by mouth as needed. 03/30/12 04/09/12  Yeily Link, PA  bisacodyl (DULCOLAX) 10 MG suppository Place 1 suppository (10 mg total) rectally daily as needed. 03/29/12 04/08/12  Fynn Vanblarcom, PA  docusate sodium 100 MG CAPS Take 100 mg by mouth 2 (two) times daily. 03/29/12 04/08/12  Giovanny Dugal, PA  iron polysaccharides (NIFEREX) 150 MG capsule  Take 1 capsule (150 mg total) by mouth 2 (two) times daily. 03/29/12 03/29/13  Keylor Rands, PA  methocarbamol (ROBAXIN) 500 MG tablet Take 1 tablet (500 mg total) by mouth every 6 (six) hours as needed.  03/29/12 04/08/12  Destiney Sanabia, PA  ondansetron (ZOFRAN) 4 MG tablet Take 1 tablet (4 mg total) by mouth every 6 (six) hours as needed for nausea. 03/29/12 04/05/12  Zyire Eidson, PA  oxyCODONE (OXY IR/ROXICODONE) 5 MG immediate release tablet Take 1-2 tablets (5-10 mg total) by mouth every 4 (four) hours as needed for pain. 03/29/12 04/08/12  Markail Diekman, PA  polyethylene glycol (MIRALAX / GLYCOLAX) packet Take 17 g by mouth daily as needed. 03/29/12 04/01/12  Maytal Mijangos Julien Girt, PA  rivaroxaban (XARELTO) 10 MG TABS tablet Take 1 tablet (10 mg total) by mouth daily with breakfast. Take Xarelto for two and a half more weeks, then discontinue Xarelto.  03/29/12   Hassie Mandt Julien Girt, PA   Diet: Cardiac diet  Activity:WBAT  Follow-up:in 2 weeks  Disposition - Skilled nursing facility - [facility]  Discharged Condition: stable   Discharge Orders    Future Orders Please Complete By Expires   Diet - low sodium heart healthy      Call MD / Call 911      Comments:   If you experience chest pain or shortness of breath, CALL 911 and be transported to the hospital emergency room.  If you develope a fever above 101 F, pus (white drainage) or increased drainage or redness at the wound, or calf pain, call your surgeon's office.   Discharge instructions      Comments:   Pick up stool softner and laxative for home. Do not submerge incision under water. May shower. Continue to use ice for pain and swelling from surgery.  Take Xarelto for two and a half more weeks, then discontinue Xarelto.  When discharged from the skilled rehab facility, please have the facility set up the patient's Home Health Physical Therapy prior to being released.  Also provide the patient with their medications at time of release from the facility to include their pain medication, the muscle relaxants, and their blood thinner medication.  If the patient is still at the rehab facility at time of follow up  appointment, please also assist the patient in arranging follow up appointment in our office and any transportation needs.   Constipation Prevention      Comments:   Drink plenty of fluids.  Prune juice may be helpful.  You may use a stool softener, such as Colace (over the counter) 100 mg twice a day.  Use MiraLax (over the counter) for constipation as needed.   Increase activity slowly as tolerated      Patient may shower      Comments:   You may shower without a dressing once there is no drainage.  Do not wash over the wound.  If drainage remains, do not shower until drainage stops.   Driving restrictions      Comments:   No driving until released by the physician.   Lifting restrictions      Comments:   No lifting until released by the physician.   TED hose      Comments:   Use stockings (TED hose) for 3 weeks on both leg(s).  You may remove them at night for sleeping.   Change dressing      Comments:   Change dressing daily with sterile 4 x 4 inch gauze  dressing and apply TED hose. Do not submerge the incision under water.   Do not put a pillow under the knee. Place it under the heel.      Do not sit on low chairs, stoools or toilet seats, as it may be difficult to get up from low surfaces          Medication List     As of 03/30/2012  8:34 AM    STOP taking these medications         multivitamin with minerals Tabs      naproxen 500 MG tablet   Commonly known as: NAPROSYN      PROBIOTIC DAILY PO      TAKE these medications         acetaminophen 325 MG tablet   Commonly known as: TYLENOL   Take 2 tablets (650 mg total) by mouth every 6 (six) hours as needed (or Fever >/= 101).      alum & mag hydroxide-simeth 200-200-20 MG/5ML suspension   Commonly known as: MAALOX/MYLANTA   Take 30 mLs by mouth as needed.      amLODipine 5 MG tablet   Commonly known as: NORVASC   Take 10 mg by mouth every morning.      aspirin EC 81 MG tablet   Take 81 mg by mouth daily.        bisacodyl 10 MG suppository   Commonly known as: DULCOLAX   Place 1 suppository (10 mg total) rectally daily as needed.      carvedilol 25 MG tablet   Commonly known as: COREG   Take 25 mg by mouth 2 (two) times daily with a meal.      doxazosin 4 MG tablet   Commonly known as: CARDURA   Take 4 mg by mouth at bedtime.      DSS 100 MG Caps   Take 100 mg by mouth 2 (two) times daily.      iron polysaccharides 150 MG capsule   Commonly known as: NIFEREX   Take 1 capsule (150 mg total) by mouth 2 (two) times daily.      METAMUCIL PO   Take 1 tablet by mouth every morning.      methocarbamol 500 MG tablet   Commonly known as: ROBAXIN   Take 1 tablet (500 mg total) by mouth every 6 (six) hours as needed.      ondansetron 4 MG tablet   Commonly known as: ZOFRAN   Take 1 tablet (4 mg total) by mouth every 6 (six) hours as needed for nausea.      oxyCODONE 5 MG immediate release tablet   Commonly known as: Oxy IR/ROXICODONE   Take 1-2 tablets (5-10 mg total) by mouth every 4 (four) hours as needed for pain.      polyethylene glycol packet   Commonly known as: MIRALAX / GLYCOLAX   Take 17 g by mouth daily as needed.      pravastatin 20 MG tablet   Commonly known as: PRAVACHOL   Take 20 mg by mouth 2 (two) times daily.      prednisoLONE acetate 1 % ophthalmic suspension   Commonly known as: PRED FORTE   Place 1 drop into the right eye every morning.      rivaroxaban 10 MG Tabs tablet   Commonly known as: XARELTO   Take 1 tablet (10 mg total) by mouth daily with breakfast.      timolol 0.5 % ophthalmic solution  Commonly known as: BETIMOL   Place 1 drop into both eyes every morning.      trospium 20 MG tablet   Commonly known as: SANCTURA   Take 20 mg by mouth at bedtime.           Follow-up Information    Follow up with Loanne Drilling, MD. Schedule an appointment as soon as possible for a visit in 2 weeks.   Contact information:   [facility] [address], [place] [address] [place] [address] [phone]          Signed: Patrica Duel 03/30/2012, 8:34 AM

## 2012-03-30 NOTE — Progress Notes (Signed)
   Subjective: 4 Days Post-Op Procedure(s) (LRB): TOTAL KNEE ARTHROPLASTY (Left) Patient reports pain as mild.   Patient seen in rounds with Dr. Lequita Halt.  He is feeling much better this morning.  HGB came up some to 8.6.  His bowels have also improved.  Overall, doing better and will transfer him over to SNF later this morning. Patient is well, and has had no acute complaints or problems Plan is to go Skilled nursing facility after hospital stay.  Objective: Vital signs in last 24 hours: Temp:  [98.2 F (36.8 C)-99 F (37.2 C)] 99 F (37.2 C) (09/13 0537) Pulse Rate:  [65-78] 66  (09/13 0537) Resp:  [14-18] 14  (09/13 0537) BP: (92-141)/(46-71) 130/66 mmHg (09/13 0537) SpO2:  [90 %-93 %] 90 % (09/13 0537)  Intake/Output from previous day:  Intake/Output Summary (Last 24 hours) at 03/30/12 0831 Last data filed at 03/30/12 0537  Gross per 24 hour  Intake 1992.5 ml  Output   1576 ml  Net  416.5 ml    Intake/Output this shift:    Labs:  Basename 03/30/12 0357 03/29/12 0405 03/28/12 0424  HGB 8.6* 7.9* 9.3*    Basename 03/30/12 0357 03/29/12 0405  WBC 8.9 9.9  RBC 2.79* 2.57*  HCT 24.2* 22.6*  PLT 137* 94*    Basename 03/30/12 0357 03/29/12 0405  NA 133* 134*  K 4.0 3.8  CL 99 99  CO2 27 26  BUN 23 23  CREATININE 1.24 1.32  GLUCOSE 144* 140*  CALCIUM 8.3* 8.4   No results found for this basename: LABPT:2,INR:2 in the last 72 hours  EXAM General - Patient is Alert, Appropriate and Oriented Extremity - Neurovascular intact Sensation intact distally Dorsiflexion/Plantar flexion intact No cellulitis present Dressing/Incision - clean, dry, no drainage, healing Motor Function - intact, moving foot and toes well on exam.   Past Medical History  Diagnosis Date  . Myocardial infarction   . Coronary artery disease     stent - 2001   . Hypertension   . Asthma   . Shortness of breath     with exertion   . Pneumonia     hx of several times per pt   . Chronic  kidney disease     frequency  . Arthritis   . Cancer     hx of skin cancer on nose  . Legally blind     Assessment/Plan: 4 Days Post-Op Procedure(s) (LRB): TOTAL KNEE ARTHROPLASTY (Left) Principal Problem:  *OA (osteoarthritis) of knee Active Problems:  Postop Acute blood loss anemia  Postop Hyponatremia   Discharge to SNF Diet - Cardiac diet  Follow up - in 2 weeks  Activity - WBAT  Disposition - Skilled nursing facility  Condition Upon Discharge - Stable  D/C Meds - See DC Summary  DVT Prophylaxis - Xarelto   Donice Alperin 03/30/2012, 8:31 AM

## 2012-03-30 NOTE — Progress Notes (Signed)
Clinical Social Work Department CLINICAL SOCIAL WORK PLACEMENT NOTE 03/30/2012  Patient:  MAURIZIO, GENO  Account Number:  [id number] Admit date:  03/26/2012  Clinical Social Worker:  Cori Razor, LCSW  Date/time:  03/27/2012 02:35 PM  Clinical Social Work is seeking post-discharge placement for this patient at the following level of care:   SKILLED NURSING   (*CSW will update this form in Epic as items are completed)     Patient/family provided with [facility] Department of Clinical Social Work's list of facilities offering this level of care within the geographic area requested by the patient (or if unable, by the patient's family).    Patient/family informed of their freedom to choose among providers that offer the needed level of care, that participate in Medicare, Medicaid or managed care program needed by the patient, have an available bed and are willing to accept the patient.    Patient/family informed of [facility]' ownership interest in [facility], as well as of the fact that they are under no obligation to receive care at this facility.  PASARR submitted to [employer] on 03/27/2012 PASARR number received from [employer] on 03/27/2012  FL2 transmitted to all facilities in geographic area requested by pt/family on  03/27/2012 FL2 transmitted to all facilities within larger geographic area on   Patient informed that his/her managed care company has contracts with or will negotiate with  certain facilities, including the following:     Patient/family informed of bed offers received:  03/27/2012 Patient chooses bed at [facility] Physician recommends and patient chooses bed at    Patient to be transferred to [facility] on  03/30/2012 Patient to be transferred to facility by [facility]  The following physician request were entered in Epic:   Additional Comments: [employer] has provided prior approval for SNF placement.

## 2014-04-01 IMAGING — CR DG CHEST 2V
2 series · 2 of 2 positions shown · non-contrast
Comparison: No priors.

CLINICAL DATA: Preoperative evaluation for total knee replacement.

CHEST - 2 VIEW

[w chest pa]
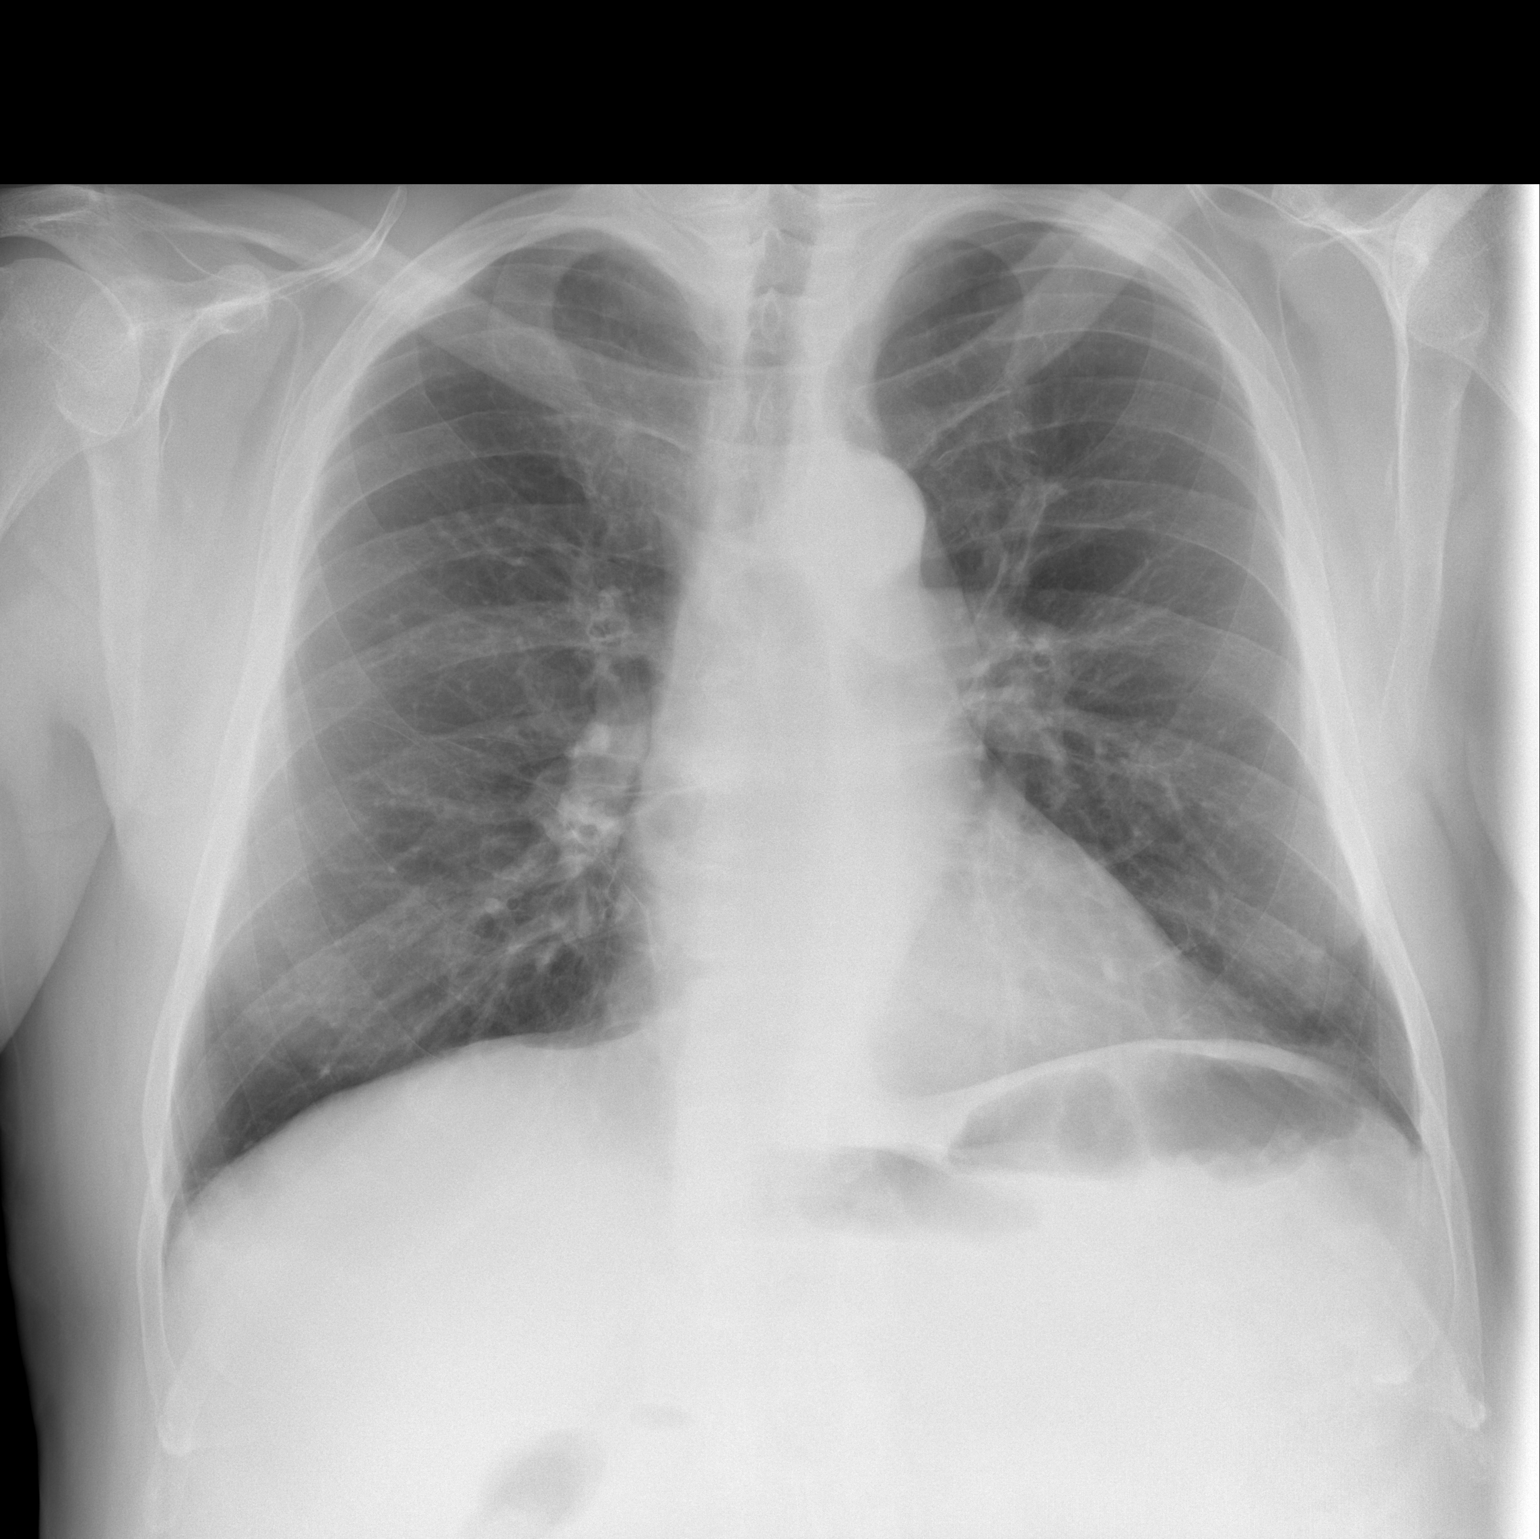

[w chest lat]
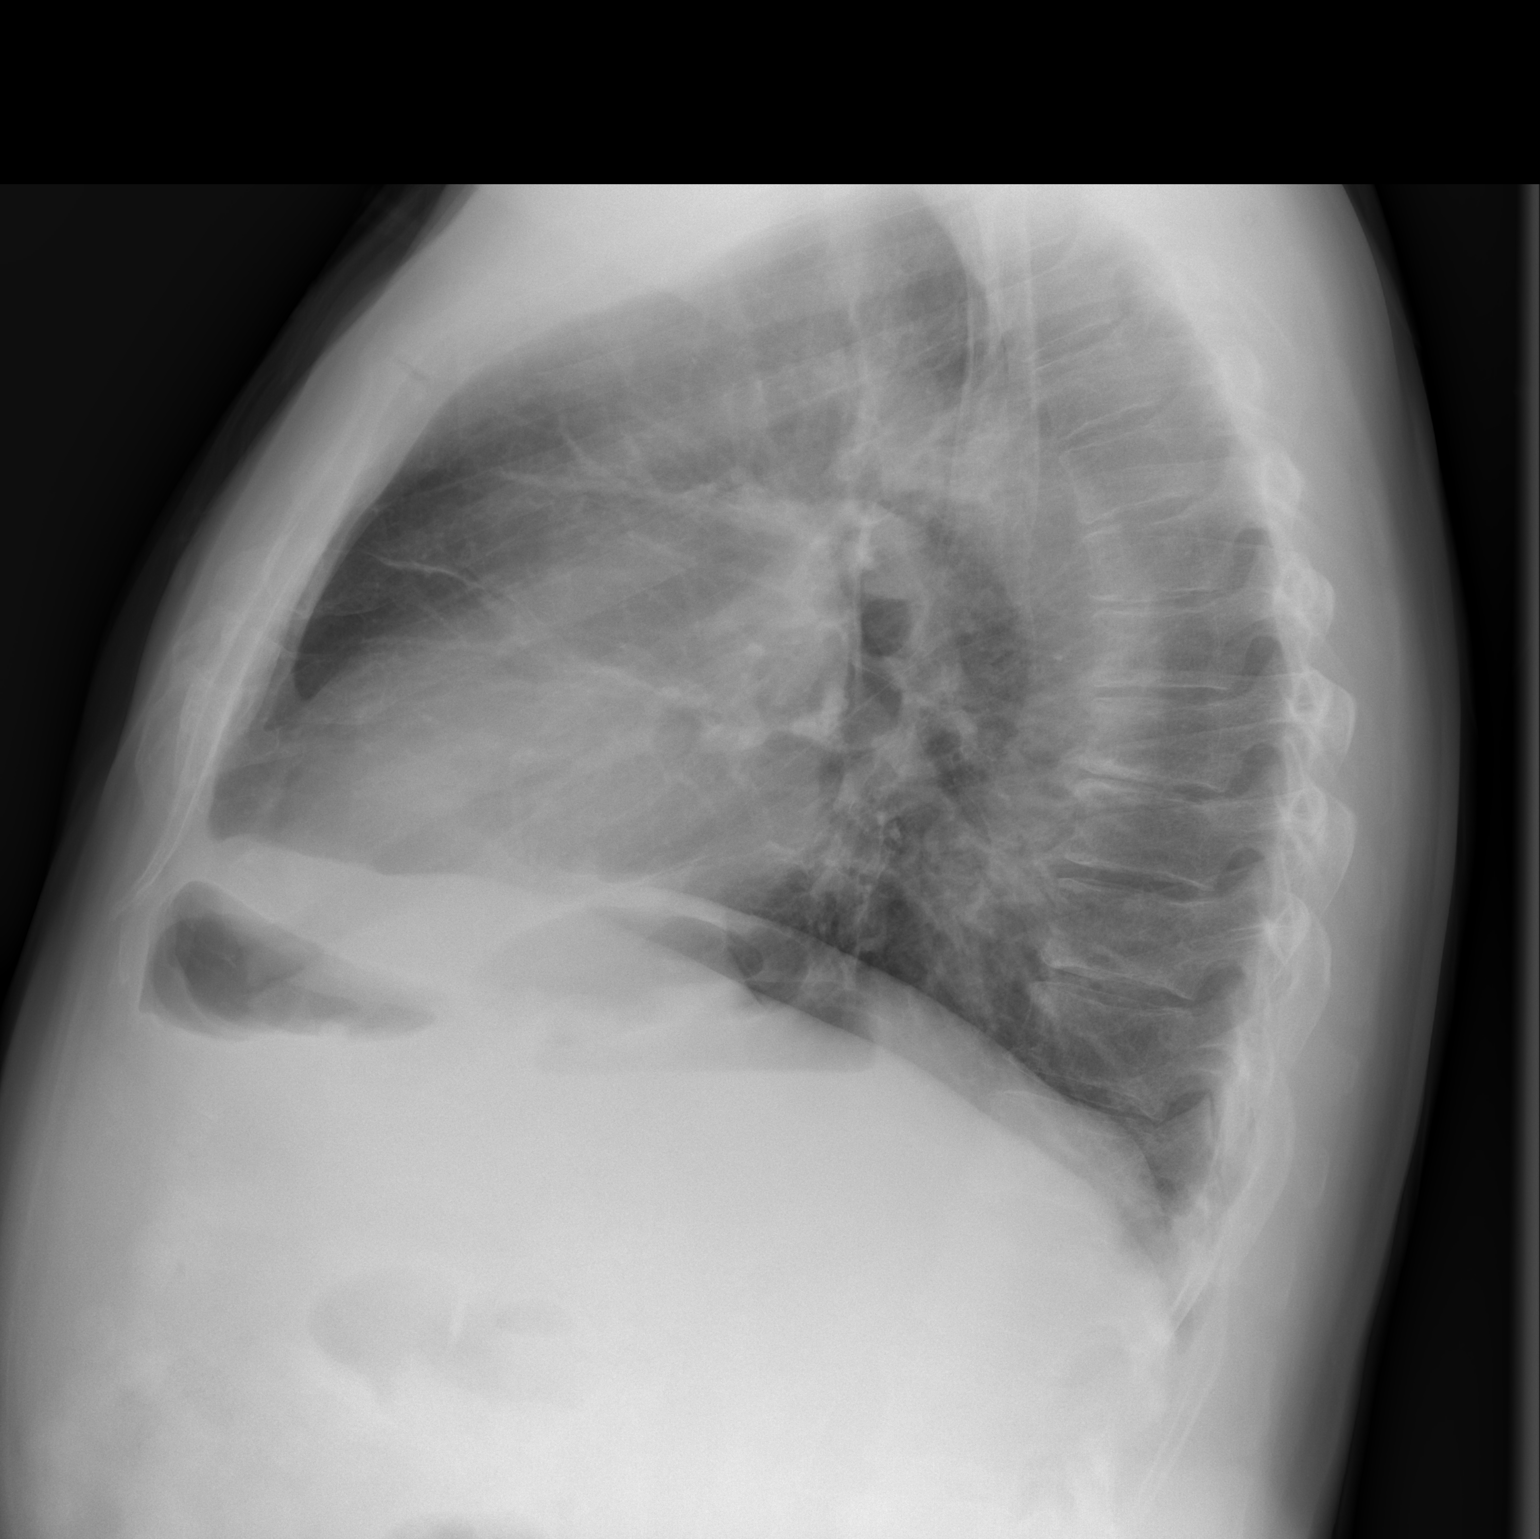

[2 of 2 positions shown; findings below may reference images not displayed]

FINDINGS: Lung volumes are normal.  No consolidative airspace
disease.  No pleural effusions.  No pneumothorax.  No pulmonary
nodule or mass noted.  Pulmonary vasculature and the
cardiomediastinal silhouette are within normal limits.
IMPRESSION: 1. No radiographic evidence of acute cardiopulmonary disease.

## 2014-08-13 DIAGNOSIS — D239 Other benign neoplasm of skin, unspecified: Secondary | ICD-10-CM

## 2014-08-13 HISTORY — DX: Other benign neoplasm of skin, unspecified: D23.9

## 2015-10-09 ENCOUNTER — Ambulatory Visit: Payer: Self-pay

## 2019-10-30 ENCOUNTER — Ambulatory Visit: Payer: Medicare HMO | Admitting: Dermatology

## 2020-01-01 ENCOUNTER — Other Ambulatory Visit: Payer: Self-pay

## 2020-01-01 ENCOUNTER — Ambulatory Visit: Payer: Medicare HMO | Admitting: Dermatology

## 2020-01-01 DIAGNOSIS — L821 Other seborrheic keratosis: Secondary | ICD-10-CM | POA: Diagnosis not present

## 2020-01-01 DIAGNOSIS — L820 Inflamed seborrheic keratosis: Secondary | ICD-10-CM | POA: Diagnosis not present

## 2020-01-01 DIAGNOSIS — L578 Other skin changes due to chronic exposure to nonionizing radiation: Secondary | ICD-10-CM

## 2020-01-01 DIAGNOSIS — L814 Other melanin hyperpigmentation: Secondary | ICD-10-CM | POA: Diagnosis not present

## 2020-01-01 DIAGNOSIS — D1801 Hemangioma of skin and subcutaneous tissue: Secondary | ICD-10-CM

## 2020-01-01 DIAGNOSIS — L570 Actinic keratosis: Secondary | ICD-10-CM

## 2020-01-01 NOTE — Progress Notes (Signed)
   Follow-Up Visit   Subjective  Paul Gaines is a 86 y.o. male who presents for the following: Actinic Keratosis (of the face - recheck for new or persistent lesions, previously treated with LN2) and ISK (groin - persistent, previously treated with LN2).  The following portions of the chart were reviewed this encounter and updated as appropriate:  Tobacco  Allergies  Meds  Problems  Med Hx  Surg Hx  Fam Hx     Review of Systems:  No other skin or systemic complaints except as noted in HPI or Assessment and Plan.  Objective  Well appearing patient in no apparent distress; mood and affect are within normal limits.  A focused examination was performed including the face, trunk, and extremities. Relevant physical exam findings are noted in the Assessment and Plan.  Objective  groin x 1, R elbow x 1, R side infra axillary x 1, R mastoid x 1, R post ankle x 1, R groin x 1, face x 25 (31): Erythematous keratotic or waxy stuck-on papule or plaque.   Objective  L cheek x 1: Erythematous thin papules/macules with gritty scale.    Assessment & Plan    Inflamed seborrheic keratosis (31) groin x 1, R elbow x 1, R side infra axillary x 1, R mastoid x 1, R post ankle x 1, R groin x 1, face x 25  Destruction of lesion - groin x 1, R elbow x 1, R side infra axillary x 1, R mastoid x 1, R post ankle x 1, R groin x 1, face x 25 Complexity: simple   Destruction method: cryotherapy   Informed consent: discussed and consent obtained   Timeout:  patient name, date of birth, surgical site, and procedure verified Lesion destroyed using liquid nitrogen: Yes   Region frozen until ice ball extended beyond lesion: Yes   Outcome: patient tolerated procedure well with no complications   Post-procedure details: wound care instructions given    AK (actinic keratosis) L cheek x 1  Destruction of lesion - L cheek x 1 Complexity: simple   Destruction method: cryotherapy   Informed consent:  discussed and consent obtained   Timeout:  patient name, date of birth, surgical site, and procedure verified Lesion destroyed using liquid nitrogen: Yes   Region frozen until ice ball extended beyond lesion: Yes   Outcome: patient tolerated procedure well with no complications   Post-procedure details: wound care instructions given     Actinic Damage - diffuse scaly erythematous macules with underlying dyspigmentation - Recommend daily broad spectrum sunscreen SPF 30+ to sun-exposed areas, reapply every 2 hours as needed.  - Call for new or changing lesions.  Seborrheic Keratoses - Stuck-on, waxy, tan-brown papules and plaques  - Discussed benign etiology and prognosis. - Observe - Call for any changes  Lentigines - Scattered tan macules - Discussed due to sun exposure - Benign, observe - Call for any changes  Hemangiomas - Red papules - Discussed benign nature - Observe - Call for any changes  Return in about 3 months (around 04/02/2020).  Luther Redo, CMA, am acting as scribe for Sarina Ser, MD .  Documentation: I have reviewed the above documentation for accuracy and completeness, and I agree with the above.  Sarina Ser, MD

## 2020-01-05 ENCOUNTER — Encounter: Payer: Self-pay | Admitting: Dermatology

## 2020-04-09 ENCOUNTER — Ambulatory Visit: Payer: Medicare HMO | Admitting: Dermatology

## 2020-12-24 DIAGNOSIS — I10 Essential (primary) hypertension: Secondary | ICD-10-CM | POA: Diagnosis not present

## 2020-12-24 DIAGNOSIS — E749 Disorder of carbohydrate metabolism, unspecified: Secondary | ICD-10-CM | POA: Diagnosis not present

## 2020-12-24 DIAGNOSIS — D638 Anemia in other chronic diseases classified elsewhere: Secondary | ICD-10-CM | POA: Diagnosis not present

## 2020-12-24 DIAGNOSIS — E785 Hyperlipidemia, unspecified: Secondary | ICD-10-CM | POA: Diagnosis not present

## 2020-12-28 DIAGNOSIS — E749 Disorder of carbohydrate metabolism, unspecified: Secondary | ICD-10-CM | POA: Diagnosis not present

## 2020-12-28 DIAGNOSIS — I10 Essential (primary) hypertension: Secondary | ICD-10-CM | POA: Diagnosis not present

## 2021-01-04 DIAGNOSIS — L600 Ingrowing nail: Secondary | ICD-10-CM | POA: Diagnosis not present

## 2021-01-04 DIAGNOSIS — M79674 Pain in right toe(s): Secondary | ICD-10-CM | POA: Diagnosis not present

## 2021-01-04 DIAGNOSIS — B351 Tinea unguium: Secondary | ICD-10-CM | POA: Diagnosis not present

## 2021-02-22 ENCOUNTER — Other Ambulatory Visit: Payer: Self-pay

## 2021-02-22 ENCOUNTER — Ambulatory Visit: Payer: Medicare HMO | Admitting: Dermatology

## 2021-02-22 DIAGNOSIS — D692 Other nonthrombocytopenic purpura: Secondary | ICD-10-CM

## 2021-02-22 DIAGNOSIS — Z86018 Personal history of other benign neoplasm: Secondary | ICD-10-CM | POA: Diagnosis not present

## 2021-02-22 DIAGNOSIS — L821 Other seborrheic keratosis: Secondary | ICD-10-CM | POA: Diagnosis not present

## 2021-02-22 DIAGNOSIS — L814 Other melanin hyperpigmentation: Secondary | ICD-10-CM

## 2021-02-22 DIAGNOSIS — L578 Other skin changes due to chronic exposure to nonionizing radiation: Secondary | ICD-10-CM

## 2021-02-22 DIAGNOSIS — D1800 Hemangioma unspecified site: Secondary | ICD-10-CM

## 2021-02-22 DIAGNOSIS — Z1283 Encounter for screening for malignant neoplasm of skin: Secondary | ICD-10-CM | POA: Diagnosis not present

## 2021-02-22 DIAGNOSIS — L570 Actinic keratosis: Secondary | ICD-10-CM | POA: Diagnosis not present

## 2021-02-22 DIAGNOSIS — D229 Melanocytic nevi, unspecified: Secondary | ICD-10-CM

## 2021-02-22 DIAGNOSIS — Z86006 Personal history of melanoma in-situ: Secondary | ICD-10-CM

## 2021-02-22 DIAGNOSIS — L820 Inflamed seborrheic keratosis: Secondary | ICD-10-CM | POA: Diagnosis not present

## 2021-02-22 NOTE — Patient Instructions (Signed)

## 2021-02-22 NOTE — Progress Notes (Signed)
Follow-Up Visit   Subjective  Paul Gaines is a 87 y.o. male who presents for the following: Annual Exam (History of SCC in situ and dysplastic nevus - TBSE today). The patient presents for Total-Body Skin Exam (TBSE) for skin cancer screening and mole check.  The following portions of the chart were reviewed this encounter and updated as appropriate:   Tobacco  Allergies  Meds  Problems  Med Hx  Surg Hx  Fam Hx     Review of Systems:  No other skin or systemic complaints except as noted in HPI or Assessment and Plan.  Objective  Well appearing patient in no apparent distress; mood and affect are within normal limits.  A full examination was performed including scalp, head, eyes, ears, nose, lips, neck, chest, axillae, abdomen, back, buttocks, bilateral upper extremities, bilateral lower extremities, hands, feet, fingers, toes, fingernails, and toenails. All findings within normal limits unless otherwise noted below.  Face, ears (11) Erythematous thin papules/macules with gritty scale.   Face x 1, legs x 4 - Total 5 (5) Erythematous keratotic or waxy stuck-on papule or plaque.    Assessment & Plan   History of Squamous Cell Carcinoma in Situ of the Skin - No evidence of recurrence today - Recommend regular full body skin exams - Recommend daily broad spectrum sunscreen SPF 30+ to sun-exposed areas, reapply every 2 hours as needed.  - Call if any new or changing lesions are noted between office visits  History of Dysplastic Nevi - No evidence of recurrence today - Recommend regular full body skin exams - Recommend daily broad spectrum sunscreen SPF 30+ to sun-exposed areas, reapply every 2 hours as needed.  - Call if any new or changing lesions are noted between office visits  Lentigines - Scattered tan macules - Due to sun exposure - Benign-appering, observe - Recommend daily broad spectrum sunscreen SPF 30+ to sun-exposed areas, reapply every 2 hours as  needed. - Call for any changes  Seborrheic Keratoses - Stuck-on, waxy, tan-brown papules and/or plaques  - Benign-appearing - Discussed benign etiology and prognosis. - Observe - Call for any changes  Melanocytic Nevi - Tan-brown and/or pink-flesh-colored symmetric macules and papules - Benign appearing on exam today - Observation - Call clinic for new or changing moles - Recommend daily use of broad spectrum spf 30+ sunscreen to sun-exposed areas.   Hemangiomas - Red papules - Discussed benign nature - Observe - Call for any changes  Actinic Damage - Chronic condition, secondary to cumulative UV/sun exposure - diffuse scaly erythematous macules with underlying dyspigmentation - Recommend daily broad spectrum sunscreen SPF 30+ to sun-exposed areas, reapply every 2 hours as needed.  - Staying in the shade or wearing long sleeves, sun glasses (UVA+UVB protection) and wide brim hats (4-inch brim around the entire circumference of the hat) are also recommended for sun protection.  - Call for new or changing lesions.  Skin cancer screening performed today.  Purpura - Chronic; persistent and recurrent.  Treatable, but not curable. - Violaceous macules and patches - Benign - Related to trauma, age, sun damage and/or use of blood thinners, chronic use of topical and/or oral steroids - Observe - Can use OTC arnica containing moisturizer such as Dermend Bruise Formula if desired - Call for worsening or other concerns  AK (actinic keratosis) (11) Face, ears  Destruction of lesion - Face, ears Complexity: simple   Destruction method: cryotherapy   Informed consent: discussed and consent obtained   Timeout:  patient name, date of birth, surgical site, and procedure verified Lesion destroyed using liquid nitrogen: Yes   Region frozen until ice ball extended beyond lesion: Yes   Outcome: patient tolerated procedure well with no complications   Post-procedure details: wound care  instructions given    Inflamed seborrheic keratosis Face x 1, legs x 4 - Total 5  Destruction of lesion - Face x 1, legs x 4 - Total 5 Complexity: simple   Destruction method: cryotherapy   Informed consent: discussed and consent obtained   Timeout:  patient name, date of birth, surgical site, and procedure verified Lesion destroyed using liquid nitrogen: Yes   Region frozen until ice ball extended beyond lesion: Yes   Outcome: patient tolerated procedure well with no complications   Post-procedure details: wound care instructions given    Skin cancer screening  Return today (on 02/22/2021) for 6-9 months , AK follow up.  I, Ashok Cordia, CMA, am acting as scribe for Sarina Ser, MD . Documentation: I have reviewed the above documentation for accuracy and completeness, and I agree with the above.  Sarina Ser, MD

## 2021-02-23 ENCOUNTER — Encounter: Payer: Self-pay | Admitting: Dermatology

## 2021-03-04 DIAGNOSIS — I10 Essential (primary) hypertension: Secondary | ICD-10-CM | POA: Diagnosis not present

## 2021-03-04 DIAGNOSIS — I340 Nonrheumatic mitral (valve) insufficiency: Secondary | ICD-10-CM | POA: Diagnosis not present

## 2021-03-04 DIAGNOSIS — I2510 Atherosclerotic heart disease of native coronary artery without angina pectoris: Secondary | ICD-10-CM | POA: Diagnosis not present

## 2021-03-04 DIAGNOSIS — E785 Hyperlipidemia, unspecified: Secondary | ICD-10-CM | POA: Diagnosis not present

## 2021-03-31 DIAGNOSIS — L600 Ingrowing nail: Secondary | ICD-10-CM | POA: Diagnosis not present

## 2021-03-31 DIAGNOSIS — M79674 Pain in right toe(s): Secondary | ICD-10-CM | POA: Diagnosis not present

## 2021-04-09 DIAGNOSIS — E785 Hyperlipidemia, unspecified: Secondary | ICD-10-CM | POA: Diagnosis not present

## 2021-04-13 DIAGNOSIS — K219 Gastro-esophageal reflux disease without esophagitis: Secondary | ICD-10-CM | POA: Diagnosis not present

## 2021-04-13 DIAGNOSIS — D638 Anemia in other chronic diseases classified elsewhere: Secondary | ICD-10-CM | POA: Diagnosis not present

## 2021-04-13 DIAGNOSIS — L409 Psoriasis, unspecified: Secondary | ICD-10-CM | POA: Diagnosis not present

## 2021-04-13 DIAGNOSIS — J301 Allergic rhinitis due to pollen: Secondary | ICD-10-CM | POA: Diagnosis not present

## 2021-04-13 DIAGNOSIS — E785 Hyperlipidemia, unspecified: Secondary | ICD-10-CM | POA: Diagnosis not present

## 2021-04-13 DIAGNOSIS — R609 Edema, unspecified: Secondary | ICD-10-CM | POA: Diagnosis not present

## 2021-04-13 DIAGNOSIS — J449 Chronic obstructive pulmonary disease, unspecified: Secondary | ICD-10-CM | POA: Diagnosis not present

## 2021-04-13 DIAGNOSIS — I2510 Atherosclerotic heart disease of native coronary artery without angina pectoris: Secondary | ICD-10-CM | POA: Diagnosis not present

## 2021-04-13 DIAGNOSIS — I10 Essential (primary) hypertension: Secondary | ICD-10-CM | POA: Diagnosis not present

## 2021-06-24 DIAGNOSIS — H401134 Primary open-angle glaucoma, bilateral, indeterminate stage: Secondary | ICD-10-CM | POA: Diagnosis not present

## 2021-06-25 DIAGNOSIS — I2510 Atherosclerotic heart disease of native coronary artery without angina pectoris: Secondary | ICD-10-CM | POA: Diagnosis not present

## 2021-06-25 DIAGNOSIS — I340 Nonrheumatic mitral (valve) insufficiency: Secondary | ICD-10-CM | POA: Diagnosis not present

## 2021-06-25 DIAGNOSIS — J449 Chronic obstructive pulmonary disease, unspecified: Secondary | ICD-10-CM | POA: Diagnosis not present

## 2021-06-25 DIAGNOSIS — E785 Hyperlipidemia, unspecified: Secondary | ICD-10-CM | POA: Diagnosis not present

## 2021-06-25 DIAGNOSIS — I351 Nonrheumatic aortic (valve) insufficiency: Secondary | ICD-10-CM | POA: Diagnosis not present

## 2021-06-25 DIAGNOSIS — I10 Essential (primary) hypertension: Secondary | ICD-10-CM | POA: Diagnosis not present

## 2021-07-08 DIAGNOSIS — E785 Hyperlipidemia, unspecified: Secondary | ICD-10-CM | POA: Diagnosis not present

## 2021-07-08 DIAGNOSIS — I2510 Atherosclerotic heart disease of native coronary artery without angina pectoris: Secondary | ICD-10-CM | POA: Diagnosis not present

## 2021-07-08 DIAGNOSIS — E749 Disorder of carbohydrate metabolism, unspecified: Secondary | ICD-10-CM | POA: Diagnosis not present

## 2021-07-13 DIAGNOSIS — J301 Allergic rhinitis due to pollen: Secondary | ICD-10-CM | POA: Diagnosis not present

## 2021-07-13 DIAGNOSIS — I2510 Atherosclerotic heart disease of native coronary artery without angina pectoris: Secondary | ICD-10-CM | POA: Diagnosis not present

## 2021-07-13 DIAGNOSIS — E785 Hyperlipidemia, unspecified: Secondary | ICD-10-CM | POA: Diagnosis not present

## 2021-07-13 DIAGNOSIS — K219 Gastro-esophageal reflux disease without esophagitis: Secondary | ICD-10-CM | POA: Diagnosis not present

## 2021-07-13 DIAGNOSIS — L409 Psoriasis, unspecified: Secondary | ICD-10-CM | POA: Diagnosis not present

## 2021-07-13 DIAGNOSIS — I10 Essential (primary) hypertension: Secondary | ICD-10-CM | POA: Diagnosis not present

## 2021-07-13 DIAGNOSIS — R609 Edema, unspecified: Secondary | ICD-10-CM | POA: Diagnosis not present

## 2021-07-13 DIAGNOSIS — J449 Chronic obstructive pulmonary disease, unspecified: Secondary | ICD-10-CM | POA: Diagnosis not present

## 2021-07-13 DIAGNOSIS — D638 Anemia in other chronic diseases classified elsewhere: Secondary | ICD-10-CM | POA: Diagnosis not present

## 2021-08-16 DIAGNOSIS — H353232 Exudative age-related macular degeneration, bilateral, with inactive choroidal neovascularization: Secondary | ICD-10-CM | POA: Diagnosis not present

## 2021-08-16 DIAGNOSIS — H2512 Age-related nuclear cataract, left eye: Secondary | ICD-10-CM | POA: Diagnosis not present

## 2021-08-26 ENCOUNTER — Ambulatory Visit: Payer: Medicare HMO | Admitting: Dermatology

## 2021-09-28 DIAGNOSIS — I10 Essential (primary) hypertension: Secondary | ICD-10-CM | POA: Diagnosis not present

## 2021-09-30 DIAGNOSIS — I351 Nonrheumatic aortic (valve) insufficiency: Secondary | ICD-10-CM | POA: Diagnosis not present

## 2021-09-30 DIAGNOSIS — I10 Essential (primary) hypertension: Secondary | ICD-10-CM | POA: Diagnosis not present

## 2021-09-30 DIAGNOSIS — I340 Nonrheumatic mitral (valve) insufficiency: Secondary | ICD-10-CM | POA: Diagnosis not present

## 2021-09-30 DIAGNOSIS — J449 Chronic obstructive pulmonary disease, unspecified: Secondary | ICD-10-CM | POA: Diagnosis not present

## 2021-09-30 DIAGNOSIS — E785 Hyperlipidemia, unspecified: Secondary | ICD-10-CM | POA: Diagnosis not present

## 2021-09-30 DIAGNOSIS — I2510 Atherosclerotic heart disease of native coronary artery without angina pectoris: Secondary | ICD-10-CM | POA: Diagnosis not present

## 2021-10-14 DIAGNOSIS — I2510 Atherosclerotic heart disease of native coronary artery without angina pectoris: Secondary | ICD-10-CM | POA: Diagnosis not present

## 2021-10-21 DIAGNOSIS — I340 Nonrheumatic mitral (valve) insufficiency: Secondary | ICD-10-CM | POA: Diagnosis not present

## 2021-10-21 DIAGNOSIS — I2510 Atherosclerotic heart disease of native coronary artery without angina pectoris: Secondary | ICD-10-CM | POA: Diagnosis not present

## 2021-10-21 DIAGNOSIS — I351 Nonrheumatic aortic (valve) insufficiency: Secondary | ICD-10-CM | POA: Diagnosis not present

## 2021-10-21 DIAGNOSIS — J449 Chronic obstructive pulmonary disease, unspecified: Secondary | ICD-10-CM | POA: Diagnosis not present

## 2021-10-21 DIAGNOSIS — E785 Hyperlipidemia, unspecified: Secondary | ICD-10-CM | POA: Diagnosis not present

## 2021-10-21 DIAGNOSIS — I10 Essential (primary) hypertension: Secondary | ICD-10-CM | POA: Diagnosis not present

## 2021-11-19 DIAGNOSIS — I2510 Atherosclerotic heart disease of native coronary artery without angina pectoris: Secondary | ICD-10-CM | POA: Diagnosis not present

## 2021-11-19 DIAGNOSIS — D638 Anemia in other chronic diseases classified elsewhere: Secondary | ICD-10-CM | POA: Diagnosis not present

## 2021-11-23 DIAGNOSIS — L409 Psoriasis, unspecified: Secondary | ICD-10-CM | POA: Diagnosis not present

## 2021-11-23 DIAGNOSIS — I10 Essential (primary) hypertension: Secondary | ICD-10-CM | POA: Diagnosis not present

## 2021-11-23 DIAGNOSIS — J449 Chronic obstructive pulmonary disease, unspecified: Secondary | ICD-10-CM | POA: Diagnosis not present

## 2021-11-23 DIAGNOSIS — D638 Anemia in other chronic diseases classified elsewhere: Secondary | ICD-10-CM | POA: Diagnosis not present

## 2021-11-23 DIAGNOSIS — J301 Allergic rhinitis due to pollen: Secondary | ICD-10-CM | POA: Diagnosis not present

## 2021-11-23 DIAGNOSIS — L298 Other pruritus: Secondary | ICD-10-CM | POA: Diagnosis not present

## 2021-11-23 DIAGNOSIS — I2510 Atherosclerotic heart disease of native coronary artery without angina pectoris: Secondary | ICD-10-CM | POA: Diagnosis not present

## 2021-11-23 DIAGNOSIS — K219 Gastro-esophageal reflux disease without esophagitis: Secondary | ICD-10-CM | POA: Diagnosis not present

## 2021-11-23 DIAGNOSIS — E785 Hyperlipidemia, unspecified: Secondary | ICD-10-CM | POA: Diagnosis not present

## 2021-11-27 ENCOUNTER — Encounter: Payer: Self-pay | Admitting: Intensive Care

## 2021-11-27 ENCOUNTER — Other Ambulatory Visit: Payer: Self-pay

## 2021-11-27 ENCOUNTER — Emergency Department: Payer: Medicare HMO

## 2021-11-27 ENCOUNTER — Emergency Department
Admission: EM | Admit: 2021-11-27 | Discharge: 2021-11-27 | Disposition: A | Discharge status: Home / Self Care | Payer: Medicare HMO | Attending: Emergency Medicine | Admitting: Emergency Medicine

## 2021-11-27 DIAGNOSIS — J45909 Unspecified asthma, uncomplicated: Secondary | ICD-10-CM | POA: Diagnosis not present

## 2021-11-27 DIAGNOSIS — J449 Chronic obstructive pulmonary disease, unspecified: Secondary | ICD-10-CM | POA: Insufficient documentation

## 2021-11-27 DIAGNOSIS — I129 Hypertensive chronic kidney disease with stage 1 through stage 4 chronic kidney disease, or unspecified chronic kidney disease: Secondary | ICD-10-CM | POA: Diagnosis not present

## 2021-11-27 DIAGNOSIS — Z85828 Personal history of other malignant neoplasm of skin: Secondary | ICD-10-CM | POA: Diagnosis not present

## 2021-11-27 DIAGNOSIS — J029 Acute pharyngitis, unspecified: Secondary | ICD-10-CM | POA: Diagnosis not present

## 2021-11-27 DIAGNOSIS — U071 COVID-19: Secondary | ICD-10-CM | POA: Diagnosis not present

## 2021-11-27 DIAGNOSIS — I2510 Atherosclerotic heart disease of native coronary artery without angina pectoris: Secondary | ICD-10-CM | POA: Insufficient documentation

## 2021-11-27 DIAGNOSIS — N189 Chronic kidney disease, unspecified: Secondary | ICD-10-CM | POA: Diagnosis not present

## 2021-11-27 DIAGNOSIS — R059 Cough, unspecified: Secondary | ICD-10-CM | POA: Diagnosis not present

## 2021-11-27 LAB — COMPREHENSIVE METABOLIC PANEL
ALT: 19 U/L (ref 0–44)
AST: 19 U/L (ref 15–41)
Albumin: 3.9 g/dL (ref 3.5–5.0)
Alkaline Phosphatase: 50 U/L (ref 38–126)
Anion gap: 6 (ref 5–15)
BUN: 27 mg/dL — ABNORMAL HIGH (ref 8–23)
CO2: 25 mmol/L (ref 22–32)
Calcium: 9 mg/dL (ref 8.9–10.3)
Chloride: 103 mmol/L (ref 98–111)
Creatinine, Ser: 1.36 mg/dL — ABNORMAL HIGH (ref 0.61–1.24)
GFR, Estimated: 51 mL/min — ABNORMAL LOW (ref >60)
Glucose, Bld: 119 mg/dL — ABNORMAL HIGH (ref 70–99)
Potassium: 4.4 mmol/L (ref 3.5–5.1)
Sodium: 134 mmol/L — ABNORMAL LOW (ref 135–145)
Total Bilirubin: 0.5 mg/dL (ref 0.3–1.2)
Total Protein: 7 g/dL (ref 6.5–8.1)

## 2021-11-27 LAB — CBC
HCT: 39.9 % (ref 39.0–52.0)
Hemoglobin: 13.1 g/dL (ref 13.0–17.0)
MCH: 30 pg (ref 26.0–34.0)
MCHC: 32.8 g/dL (ref 30.0–36.0)
MCV: 91.5 fL (ref 80.0–100.0)
Platelets: 139 10*3/uL — ABNORMAL LOW (ref 150–400)
RBC: 4.36 MIL/uL (ref 4.22–5.81)
RDW: 13.9 % (ref 11.5–15.5)
WBC: 6.9 10*3/uL (ref 4.0–10.5)
nRBC: 0 % (ref 0.0–0.2)

## 2021-11-27 LAB — RESP PANEL BY RT-PCR (FLU A&B, COVID) ARPGX2
Influenza A by PCR: NEGATIVE
Influenza B by PCR: NEGATIVE
SARS Coronavirus 2 by RT PCR: POSITIVE — AB

## 2021-11-27 LAB — LIPASE, BLOOD: Lipase: 45 U/L (ref 11–51)

## 2021-11-27 MED ORDER — ACETAMINOPHEN 500 MG PO TABS
1000.0000 mg | ORAL_TABLET | Freq: Once | ORAL | Status: AC
  Administered 2021-11-27: 1000 mg via ORAL
  Filled 2021-11-27: qty 2

## 2021-11-27 MED ORDER — SODIUM CHLORIDE 0.9 % IV BOLUS
1000.0000 mL | Freq: Once | INTRAVENOUS | Status: AC
Start: 2021-11-27 — End: 2021-11-27
  Administered 2021-11-27: 1000 mL via INTRAVENOUS

## 2021-11-27 NOTE — ED Triage Notes (Signed)
Patient c/o cough, sore throat, body aches and diarrhea since Wednesday. No fevers ?

## 2021-11-27 NOTE — ED Provider Notes (Signed)
? ?[facility] ?Provider Note ? ? ? Event Date/Time  ? First MD Initiated Contact with Patient 11/27/21 1217   ?  (approximate) ? ? ?History  ? ?Cough, Diarrhea, and Sore Throat ? ? ?HPI ? ?Paul Gaines is a 88 y.o. male with past medical history of coronary disease status post stenting, CKD, COPD presents with cough congestion diarrhea fatigue poor appetite.  Patient's wife started with really the exact same symptoms last Saturday but patient did not become sick until 3 days ago on Wednesday.  He has cough nasal congestion and some dyspnea.  Denies associated chest pain.  Has not had fever.  Also is having multiple episodes of diarrhea per day.  No blood in his stool denies abdominal pain.  Feels like his breathing difficulty related to sinus congestion.  He is tolerating p.o. but has decreased appetite and has not eaten very much. ?  ? ?Past Medical History:  ?Diagnosis Date  ? Arthritis   ? Asthma   ? Cancer [facility])   ? hx of skin cancer on nose  ? Chronic kidney disease   ? frequency  ? Coronary artery disease   ? stent - 2001   ? Dysplastic nevus 08/13/2014  ? left post distal deltoid  ? Hypertension   ? Legally blind   ? Myocardial infarction [facility])   ? Pneumonia   ? hx of several times per pt   ? Shortness of breath   ? with exertion   ? Squamous cell carcinoma of skin 07/01/2010  ? left infraorbital (in situ)  ? ? ?Patient Active Problem List  ? Diagnosis Date Noted  ? Postop Acute blood loss anemia 03/28/2012  ? Postop Hyponatremia 03/28/2012  ? OA (osteoarthritis) of knee 03/26/2012  ? ? ? ?Physical Exam  ?Triage Vital Signs: ?ED Triage Vitals [11/27/21 1155]  ?Enc Vitals Group  ?   BP 126/72  ?   Pulse Rate (!) 56  ?   Resp 18  ?   Temp 98.4 ?F (36.9 ?C)  ?   Temp Source Oral  ?   SpO2 95 %  ?   Weight 176 lb (79.8 kg)  ?   Height '5\' 11"'$  (1.803 m)  ?   Head Circumference   ?   Peak Flow   ?   Pain Score 7  ?   Pain Loc   ?   Pain Edu?   ?   Excl. in [place]?   ? ? ?Most recent vital  signs: ?Vitals:  ? 11/27/21 1155  ?BP: 126/72  ?Pulse: (!) 56  ?Resp: 18  ?Temp: 98.4 ?F (36.9 ?C)  ?SpO2: 95%  ? ? ? ?General: Awake, no distress.  ?CV:  Good peripheral perfusion.  No peripheral edema ?Resp:  Normal effort.  Lungs are clear no increased work of breathing ?Abd:  No distention.  Soft and nontender ?Neuro:             Awake, Alert, Oriented x 3  ?Other:  Mild erythema the posterior oropharynx, dry mucous membranes ? ? ?ED Results / Procedures / Treatments  ?Labs ?(all labs ordered are listed, but only abnormal results are displayed) ?Labs Reviewed  ?RESP PANEL BY RT-PCR (FLU A&B, COVID) ARPGX2 - Abnormal; Notable for the following components:  ?    Result Value  ? SARS Coronavirus 2 by RT PCR POSITIVE (*)   ? All other components within normal limits  ?COMPREHENSIVE METABOLIC PANEL - Abnormal; Notable for the  following components:  ? Sodium 134 (*)   ? Glucose, Bld 119 (*)   ? BUN 27 (*)   ? Creatinine, Ser 1.36 (*)   ? GFR, Estimated 51 (*)   ? All other components within normal limits  ?CBC - Abnormal; Notable for the following components:  ? Platelets 139 (*)   ? All other components within normal limits  ?LIPASE, BLOOD  ?URINALYSIS, ROUTINE W REFLEX MICROSCOPIC  ? ? ? ?EKG ? ? ? ? ?RADIOLOGY ?I reviewed the CXR which does not show any acute cardiopulmonary process; agree with radiology report  ? ? ? ?PROCEDURES: ? ?Critical Care performed: No ? ?Procedures ? ? ?MEDICATIONS ORDERED IN ED: ?Medications  ?sodium chloride 0.9 % bolus 1,000 mL (1,000 mLs Intravenous New Bag/Given 11/27/21 1306)  ?acetaminophen (TYLENOL) tablet 1,000 mg (1,000 mg Oral Given 11/27/21 1306)  ? ? ? ?IMPRESSION / MDM / ASSESSMENT AND PLAN / ED COURSE  ?I reviewed the triage vital signs and the nursing notes. ?             ?               ? ?Differential diagnosis includes, but is not limited to, viral respiratory illness viral GI illness, AKI, dehydration, electrolyte abnormality, pneumonia ? ?Patient is a 88 year old male  presents with multiple symptoms including sore throat body aches cough diarrhea fatigue decreased appetite.  Patient's wife has essentially the same symptoms and her illness started earlier about a week ago this patient is only been sick for the last 4 days.  His diarrhea is nonbloody and not associate abdominal pain or fever.  He has nasal congestion and some cough as well as some dyspnea which he thinks is primarily related to his nasal congestion.  Still tolerating p.o. but not eating very much feeling very fatigued.  His vital signs are within normal limits he appears nontoxic but does appear somewhat dry.  Abdomen is benign.  I reviewed his labs which are overall reassuring he has no leukocytosis his electrolytes are essentially normal creatinine 1.36 which is near his baseline.  We will get a chest x-ray to rule out pneumonia given his respiratory symptoms.  Overall I suspect a viral respiratory illness.  We will send COVID and flu testing.  We will give him a liter of fluid and some Tylenol. ? ?Patient is COVID-positive.  His chest x-ray does not show any evidence of COVID-pneumonia saturating well.  He has only had 1 episode of diarrhea today.  Overall with stable vital signs and reassuring lab work-up I think he is appropriate for outpatient treatment. ? ? ?FINAL CLINICAL IMPRESSION(S) / ED DIAGNOSES  ? ?Final diagnoses:  ?COVID-19  ? ? ? ?Rx / DC Orders  ? ?ED Discharge Orders   ? ? None  ? ?  ? ? ? ?Note:  This document was prepared using Dragon voice recognition software and may include unintentional dictation errors. ?  ?Rada Hay, MD ?11/27/21 1420 ? ?

## 2021-11-27 NOTE — ED Notes (Signed)
See triage note  Presents with some body aches ,cough and diarrhea  sxs' started on Wednesday no fever  last diarrhea stool was this am ?

## 2021-12-14 DIAGNOSIS — J449 Chronic obstructive pulmonary disease, unspecified: Secondary | ICD-10-CM | POA: Diagnosis not present

## 2021-12-14 DIAGNOSIS — K219 Gastro-esophageal reflux disease without esophagitis: Secondary | ICD-10-CM | POA: Diagnosis not present

## 2021-12-14 DIAGNOSIS — L409 Psoriasis, unspecified: Secondary | ICD-10-CM | POA: Diagnosis not present

## 2021-12-14 DIAGNOSIS — N179 Acute kidney failure, unspecified: Secondary | ICD-10-CM | POA: Diagnosis not present

## 2021-12-14 DIAGNOSIS — I10 Essential (primary) hypertension: Secondary | ICD-10-CM | POA: Diagnosis not present

## 2021-12-14 DIAGNOSIS — R609 Edema, unspecified: Secondary | ICD-10-CM | POA: Diagnosis not present

## 2021-12-14 DIAGNOSIS — D638 Anemia in other chronic diseases classified elsewhere: Secondary | ICD-10-CM | POA: Diagnosis not present

## 2021-12-14 DIAGNOSIS — I2510 Atherosclerotic heart disease of native coronary artery without angina pectoris: Secondary | ICD-10-CM | POA: Diagnosis not present

## 2021-12-14 DIAGNOSIS — J301 Allergic rhinitis due to pollen: Secondary | ICD-10-CM | POA: Diagnosis not present

## 2021-12-23 DIAGNOSIS — E119 Type 2 diabetes mellitus without complications: Secondary | ICD-10-CM | POA: Diagnosis not present

## 2021-12-23 DIAGNOSIS — H353232 Exudative age-related macular degeneration, bilateral, with inactive choroidal neovascularization: Secondary | ICD-10-CM | POA: Diagnosis not present

## 2021-12-23 DIAGNOSIS — H401134 Primary open-angle glaucoma, bilateral, indeterminate stage: Secondary | ICD-10-CM | POA: Diagnosis not present

## 2021-12-31 DIAGNOSIS — I10 Essential (primary) hypertension: Secondary | ICD-10-CM | POA: Diagnosis not present

## 2021-12-31 DIAGNOSIS — E785 Hyperlipidemia, unspecified: Secondary | ICD-10-CM | POA: Diagnosis not present

## 2022-01-04 DIAGNOSIS — L409 Psoriasis, unspecified: Secondary | ICD-10-CM | POA: Diagnosis not present

## 2022-01-04 DIAGNOSIS — J301 Allergic rhinitis due to pollen: Secondary | ICD-10-CM | POA: Diagnosis not present

## 2022-01-04 DIAGNOSIS — J449 Chronic obstructive pulmonary disease, unspecified: Secondary | ICD-10-CM | POA: Diagnosis not present

## 2022-01-04 DIAGNOSIS — K219 Gastro-esophageal reflux disease without esophagitis: Secondary | ICD-10-CM | POA: Diagnosis not present

## 2022-01-04 DIAGNOSIS — N179 Acute kidney failure, unspecified: Secondary | ICD-10-CM | POA: Diagnosis not present

## 2022-01-04 DIAGNOSIS — I10 Essential (primary) hypertension: Secondary | ICD-10-CM | POA: Diagnosis not present

## 2022-01-04 DIAGNOSIS — I2510 Atherosclerotic heart disease of native coronary artery without angina pectoris: Secondary | ICD-10-CM | POA: Diagnosis not present

## 2022-01-04 DIAGNOSIS — R609 Edema, unspecified: Secondary | ICD-10-CM | POA: Diagnosis not present

## 2022-01-04 DIAGNOSIS — D638 Anemia in other chronic diseases classified elsewhere: Secondary | ICD-10-CM | POA: Diagnosis not present

## 2022-03-07 DIAGNOSIS — I340 Nonrheumatic mitral (valve) insufficiency: Secondary | ICD-10-CM | POA: Diagnosis not present

## 2022-03-07 DIAGNOSIS — E785 Hyperlipidemia, unspecified: Secondary | ICD-10-CM | POA: Diagnosis not present

## 2022-03-07 DIAGNOSIS — I351 Nonrheumatic aortic (valve) insufficiency: Secondary | ICD-10-CM | POA: Diagnosis not present

## 2022-03-07 DIAGNOSIS — I2510 Atherosclerotic heart disease of native coronary artery without angina pectoris: Secondary | ICD-10-CM | POA: Diagnosis not present

## 2022-03-07 DIAGNOSIS — J449 Chronic obstructive pulmonary disease, unspecified: Secondary | ICD-10-CM | POA: Diagnosis not present

## 2022-03-07 DIAGNOSIS — I10 Essential (primary) hypertension: Secondary | ICD-10-CM | POA: Diagnosis not present

## 2022-04-07 DIAGNOSIS — R7303 Prediabetes: Secondary | ICD-10-CM | POA: Diagnosis not present

## 2022-04-07 DIAGNOSIS — E785 Hyperlipidemia, unspecified: Secondary | ICD-10-CM | POA: Diagnosis not present

## 2022-04-07 DIAGNOSIS — E669 Obesity, unspecified: Secondary | ICD-10-CM | POA: Diagnosis not present

## 2022-04-07 DIAGNOSIS — I10 Essential (primary) hypertension: Secondary | ICD-10-CM | POA: Diagnosis not present

## 2022-04-07 DIAGNOSIS — N400 Enlarged prostate without lower urinary tract symptoms: Secondary | ICD-10-CM | POA: Diagnosis not present

## 2022-04-12 DIAGNOSIS — J449 Chronic obstructive pulmonary disease, unspecified: Secondary | ICD-10-CM | POA: Diagnosis not present

## 2022-04-12 DIAGNOSIS — I10 Essential (primary) hypertension: Secondary | ICD-10-CM | POA: Diagnosis not present

## 2022-04-12 DIAGNOSIS — Z1331 Encounter for screening for depression: Secondary | ICD-10-CM | POA: Diagnosis not present

## 2022-04-12 DIAGNOSIS — L409 Psoriasis, unspecified: Secondary | ICD-10-CM | POA: Diagnosis not present

## 2022-04-12 DIAGNOSIS — I2510 Atherosclerotic heart disease of native coronary artery without angina pectoris: Secondary | ICD-10-CM | POA: Diagnosis not present

## 2022-04-12 DIAGNOSIS — K219 Gastro-esophageal reflux disease without esophagitis: Secondary | ICD-10-CM | POA: Diagnosis not present

## 2022-04-12 DIAGNOSIS — Z0001 Encounter for general adult medical examination with abnormal findings: Secondary | ICD-10-CM | POA: Diagnosis not present

## 2022-04-12 DIAGNOSIS — N179 Acute kidney failure, unspecified: Secondary | ICD-10-CM | POA: Diagnosis not present

## 2022-04-12 DIAGNOSIS — D638 Anemia in other chronic diseases classified elsewhere: Secondary | ICD-10-CM | POA: Diagnosis not present

## 2022-05-24 ENCOUNTER — Encounter: Payer: Self-pay | Admitting: Dermatology

## 2022-05-24 ENCOUNTER — Ambulatory Visit: Payer: Medicare HMO | Admitting: Dermatology

## 2022-05-24 DIAGNOSIS — L821 Other seborrheic keratosis: Secondary | ICD-10-CM | POA: Diagnosis not present

## 2022-05-24 DIAGNOSIS — B078 Other viral warts: Secondary | ICD-10-CM | POA: Diagnosis not present

## 2022-05-24 DIAGNOSIS — D0439 Carcinoma in situ of skin of other parts of face: Secondary | ICD-10-CM | POA: Diagnosis not present

## 2022-05-24 DIAGNOSIS — D099 Carcinoma in situ, unspecified: Secondary | ICD-10-CM

## 2022-05-24 DIAGNOSIS — L820 Inflamed seborrheic keratosis: Secondary | ICD-10-CM

## 2022-05-24 DIAGNOSIS — L814 Other melanin hyperpigmentation: Secondary | ICD-10-CM

## 2022-05-24 DIAGNOSIS — D492 Neoplasm of unspecified behavior of bone, soft tissue, and skin: Secondary | ICD-10-CM

## 2022-05-24 DIAGNOSIS — L570 Actinic keratosis: Secondary | ICD-10-CM

## 2022-05-24 HISTORY — DX: Carcinoma in situ, unspecified: D09.9

## 2022-05-24 NOTE — Patient Instructions (Signed)
Wound Care Instructions  Cleanse wound gently with soap and water once a day then pat dry with clean gauze. Apply a thin coat of Petrolatum (petroleum jelly, "Vaseline") over the wound (unless you have an allergy to this). We recommend that you use a new, sterile tube of Vaseline. Do not pick or remove scabs. Do not remove the yellow or white "healing tissue" from the base of the wound.  Cover the wound with fresh, clean, nonstick gauze and secure with paper tape. You may use Band-Aids in place of gauze and tape if the wound is small enough, but would recommend trimming much of the tape off as there is often too much. Sometimes Band-Aids can irritate the skin.  You should call the office for your biopsy report after 1 week if you have not already been contacted.  If you experience any problems, such as abnormal amounts of bleeding, swelling, significant bruising, significant pain, or evidence of infection, please call the office immediately.  FOR ADULT SURGERY PATIENTS: If you need something for pain relief you may take 1 extra strength Tylenol (acetaminophen) AND 2 Ibuprofen (200mg each) together every 4 hours as needed for pain. (do not take these if you are allergic to them or if you have a reason you should not take them.) Typically, you may only need pain medication for 1 to 3 days.     Cryotherapy Aftercare  Wash gently with soap and water everyday.   Apply Vaseline and Band-Aid daily until healed.    Due to recent changes in healthcare laws, you may see results of your pathology and/or laboratory studies on MyChart before the doctors have had a chance to review them. We understand that in some cases there may be results that are confusing or concerning to you. Please understand that not all results are received at the same time and often the doctors may need to interpret multiple results in order to provide you with the best plan of care or course of treatment. Therefore, we ask that you  please give us 2 business days to thoroughly review all your results before contacting the office for clarification. Should we see a critical lab result, you will be contacted sooner.   If You Need Anything After Your Visit  If you have any questions or concerns for your doctor, please call our main line at [phone] and press option 4 to reach your doctor's medical assistant. If no one answers, please leave a voicemail as directed and we will return your call as soon as possible. Messages left after 4 pm will be answered the following business day.   You may also send us a message via MyChart. We typically respond to MyChart messages within 1-2 business days.  For prescription refills, please ask your pharmacy to contact our office. Our fax number is [phone].  If you have an urgent issue when the clinic is closed that cannot wait until the next business day, you can page your doctor at the number below.    Please note that while we do our best to be available for urgent issues outside of office hours, we are not available 24/7.   If you have an urgent issue and are unable to reach us, you may choose to seek medical care at your doctor's office, retail clinic, urgent care center, or emergency room.  If you have a medical emergency, please immediately call 911 or go to the emergency department.  Pager Numbers  - Dr. Kowalski: [phone]  -   Dr. Moye: [phone]  - Dr. Stewart: [phone]  In the event of inclement weather, please call our main line at [phone] for an update on the status of any delays or closures.  Dermatology Medication Tips: Please keep the boxes that topical medications come in in order to help keep track of the instructions about where and how to use these. Pharmacies typically print the medication instructions only on the boxes and not directly on the medication tubes.   If your medication is too expensive, please contact our office at  [phone] option 4 or send us a message through MyChart.   We are unable to tell what your co-pay for medications will be in advance as this is different depending on your insurance coverage. However, we may be able to find a substitute medication at lower cost or fill out paperwork to get insurance to cover a needed medication.   If a prior authorization is required to get your medication covered by your insurance company, please allow us 1-2 business days to complete this process.  Drug prices often vary depending on where the prescription is filled and some pharmacies may offer cheaper prices.  The website www.goodrx.com contains coupons for medications through different pharmacies. The prices here do not account for what the cost may be with help from insurance (it may be cheaper with your insurance), but the website can give you the price if you did not use any insurance.  - You can print the associated coupon and take it with your prescription to the pharmacy.  - You may also stop by our office during regular business hours and pick up a GoodRx coupon card.  - If you need your prescription sent electronically to a different pharmacy, notify our office through [facility] MyChart or by phone at [phone] option 4.     Si Usted Necesita Algo Despus de Su Visita  Tambin puede enviarnos un mensaje a travs de MyChart. Por lo general respondemos a los mensajes de MyChart en el transcurso de 1 a 2 das hbiles.  Para renovar recetas, por favor pida a su farmacia que se ponga en contacto con nuestra oficina. Nuestro nmero de fax es el [phone].  Si tiene un asunto urgente cuando la clnica est cerrada y que no puede esperar hasta el siguiente da hbil, puede llamar/localizar a su doctor(a) al nmero que aparece a continuacin.   Por favor, tenga en cuenta que aunque hacemos todo lo posible para estar disponibles para asuntos urgentes fuera del horario de oficina, no estamos  disponibles las 24 horas del da, los 7 das de la semana.   Si tiene un problema urgente y no puede comunicarse con nosotros, puede optar por buscar atencin mdica  en el consultorio de su doctor(a), en una clnica privada, en un centro de atencin urgente o en una sala de emergencias.  Si tiene una emergencia mdica, por favor llame inmediatamente al 911 o vaya a la sala de emergencias.  Nmeros de bper  - Dr. Kowalski: [phone]  - Dra. Moye: [phone]  - Dra. Stewart: [phone]  En caso de inclemencias del tiempo, por favor llame a nuestra lnea principal al [phone] para una actualizacin sobre el estado de cualquier retraso o cierre.  Consejos para la medicacin en dermatologa: Por favor, guarde las cajas en las que vienen los medicamentos de uso tpico para ayudarle a seguir las instrucciones sobre dnde y cmo usarlos. Las farmacias generalmente imprimen las instrucciones del medicamento slo en las cajas y   no directamente en los tubos del medicamento.   Si su medicamento es muy caro, por favor, pngase en contacto con nuestra oficina llamando al [phone] y presione la opcin 4 o envenos un mensaje a travs de MyChart.   No podemos decirle cul ser su copago por los medicamentos por adelantado ya que esto es diferente dependiendo de la cobertura de su seguro. Sin embargo, es posible que podamos encontrar un medicamento sustituto a menor costo o llenar un formulario para que el seguro cubra el medicamento que se considera necesario.   Si se requiere una autorizacin previa para que su compaa de seguros cubra su medicamento, por favor permtanos de 1 a 2 das hbiles para completar este proceso.  Los precios de los medicamentos varan con frecuencia dependiendo del lugar de dnde se surte la receta y alguna farmacias pueden ofrecer precios ms baratos.  El sitio web www.goodrx.com tiene cupones para medicamentos de diferentes farmacias. Los precios aqu no  tienen en cuenta lo que podra costar con la ayuda del seguro (puede ser ms barato con su seguro), pero el sitio web puede darle el precio si no utiliz ningn seguro.  - Puede imprimir el cupn correspondiente y llevarlo con su receta a la farmacia.  - Tambin puede pasar por nuestra oficina durante el horario de atencin regular y recoger una tarjeta de cupones de GoodRx.  - Si necesita que su receta se enve electrnicamente a una farmacia diferente, informe a nuestra oficina a travs de MyChart de [place] o por telfono llamando al [phone] y presione la opcin 4.  

## 2022-05-24 NOTE — Progress Notes (Signed)
Follow-Up Visit   Subjective  Paul Gaines is a 88 y.o. male who presents for the following: lesions (Face and neck. Itching at times).  The patient has spots, moles and lesions to be evaluated, some may be new or changing and the patient has concerns that these could be cancer.   The following portions of the chart were reviewed this encounter and updated as appropriate:  Tobacco  Allergies  Meds  Problems  Med Hx  Surg Hx  Fam Hx      Review of Systems: No other skin or systemic complaints except as noted in HPI or Assessment and Plan.   Objective  Well appearing patient in no apparent distress; mood and affect are within normal limits.  A focused examination was performed including face, neck. Relevant physical exam findings are noted in the Assessment and Plan.  Left Temple 1.0cm thin pink plaque with cutaneous horn     Right Cheek x1 Verrucous papules -- Discussed viral etiology and contagion.   Right Temple x1, left lateral zygoma x2, right helix hypertrophic AK x1 (4) Erythematous thin papules/macules with gritty scale.   Neck - Anterior x2 (2) Erythematous keratotic or waxy stuck-on papule or plaque.   Assessment & Plan  Neoplasm of skin Left Temple  Skin / nail biopsy Type of biopsy: tangential   Informed consent: discussed and consent obtained   Anesthesia: the lesion was anesthetized in a standard fashion   Anesthesia comment:  Area prepped with alcohol Anesthetic:  1% lidocaine w/ epinephrine 1-100,000 buffered w/ 8.4% NaHCO3 Instrument used: flexible razor blade   Hemostasis achieved with: pressure, aluminum chloride and electrodesiccation   Outcome: patient tolerated procedure well   Post-procedure details: wound care instructions given   Post-procedure details comment:  Ointment and small bandage applied  Specimen 1 - Surgical pathology Differential Diagnosis: R/O SCC  Check Margins: No  Discussed Mohs surgery. Plan Mohs if +skin  cancer.  Other viral warts Right Cheek x1  Viral Wart (HPV) Counseling  Discussed viral / HPV (Human Papilloma Virus) etiology and risk of spread /infectivity to other areas of body as well as to other people.  Multiple treatments and methods may be required to clear warts and it is possible treatment may not be successful.  Treatment risks include discoloration; scarring and there is still potential for wart recurrence.  Destruction of lesion - Right Cheek x1  Destruction method: cryotherapy   Informed consent: discussed and consent obtained   Lesion destroyed using liquid nitrogen: Yes   Region frozen until ice ball extended beyond lesion: Yes   Outcome: patient tolerated procedure well with no complications   Post-procedure details: wound care instructions given   Additional details:  Prior to procedure, discussed risks of blister formation, small wound, skin dyspigmentation, or rare scar following cryotherapy. Recommend Vaseline ointment to treated areas while healing.   AK (actinic keratosis) (4) Right Temple x1, left lateral zygoma x2, right helix hypertrophic AK x1  Actinic keratoses are precancerous spots that appear secondary to cumulative UV radiation exposure/sun exposure over time. They are chronic with expected duration over 1 year. A portion of actinic keratoses will progress to squamous cell carcinoma of the skin. It is not possible to reliably predict which spots will progress to skin cancer and so treatment is recommended to prevent development of skin cancer.  Recommend daily broad spectrum sunscreen SPF 30+ to sun-exposed areas, reapply every 2 hours as needed.  Recommend staying in the shade or wearing long  sleeves, sun glasses (UVA+UVB protection) and wide brim hats (4-inch brim around the entire circumference of the hat). Call for new or changing lesions.  Destruction of lesion - Right Temple x1, left lateral zygoma x2, right helix hypertrophic AK x1  Destruction  method: cryotherapy   Informed consent: discussed and consent obtained   Lesion destroyed using liquid nitrogen: Yes   Region frozen until ice ball extended beyond lesion: Yes   Outcome: patient tolerated procedure well with no complications   Post-procedure details: wound care instructions given   Additional details:  Prior to procedure, discussed risks of blister formation, small wound, skin dyspigmentation, or rare scar following cryotherapy. Recommend Vaseline ointment to treated areas while healing.   Inflamed seborrheic keratosis (2) Neck - Anterior x2  Benign-appearing.  Observation.  Call clinic for new or changing lesions.   Symptomatic, irritating, patient would like treated.  Destruction of lesion - Neck - Anterior x2  Destruction method: cryotherapy   Informed consent: discussed and consent obtained   Lesion destroyed using liquid nitrogen: Yes   Region frozen until ice ball extended beyond lesion: Yes   Outcome: patient tolerated procedure well with no complications   Post-procedure details: wound care instructions given   Additional details:  Prior to procedure, discussed risks of blister formation, small wound, skin dyspigmentation, or rare scar following cryotherapy. Recommend Vaseline ointment to treated areas while healing.    Seborrheic Keratoses - Stuck-on, waxy, tan-brown papules and/or plaques  - Benign-appearing - Discussed benign etiology and prognosis. - Observe - Call for any changes  Lentigines - Scattered tan macules - Due to sun exposure - Benign-appearing, observe - Recommend daily broad spectrum sunscreen SPF 30+ to sun-exposed areas, reapply every 2 hours as needed. - Call for any changes  Return for TBSE, Next Available.  I, Emelia Salisbury, CMA, am acting as scribe for Forest Gleason, MD.  Documentation: I have reviewed the above documentation for accuracy and completeness, and I agree with the above.  Forest Gleason, MD

## 2022-05-28 ENCOUNTER — Encounter: Payer: Self-pay | Admitting: Dermatology

## 2022-05-31 ENCOUNTER — Telehealth: Payer: Self-pay

## 2022-05-31 DIAGNOSIS — D0439 Carcinoma in situ of skin of other parts of face: Secondary | ICD-10-CM

## 2022-05-31 NOTE — Telephone Encounter (Signed)
Discussed pathology results. Will send referral for Mohs. Patient will discuss with his wife and call back with location he wants for Mohs. JP

## 2022-05-31 NOTE — Telephone Encounter (Signed)
-----   Message from Alfonso Patten, MD sent at 05/31/2022  3:28 PM EST ----- Skin , left temple SQUAMOUS CELL CARCINOMA IN SITU --> Mohs surgery (Dr. Lacinda Axon or Dr. Winifred Olive are fine, or Dr. Manley Mason at [facility] if he prefers).  MAs please call with results and refer. Please let me know if they have any questions. Thank you!

## 2022-06-01 NOTE — Telephone Encounter (Signed)
Patient returned call and would like to go to [facility]. Referral emailed. aw

## 2022-06-01 NOTE — Addendum Note (Signed)
Addended by: Johnsie Kindred R on: 06/01/2022 01:47 PM   Modules accepted: Orders

## 2022-07-20 DIAGNOSIS — D0439 Carcinoma in situ of skin of other parts of face: Secondary | ICD-10-CM | POA: Diagnosis not present

## 2022-07-22 DIAGNOSIS — I2510 Atherosclerotic heart disease of native coronary artery without angina pectoris: Secondary | ICD-10-CM | POA: Diagnosis not present

## 2022-07-25 DIAGNOSIS — I2510 Atherosclerotic heart disease of native coronary artery without angina pectoris: Secondary | ICD-10-CM | POA: Diagnosis not present

## 2022-07-25 DIAGNOSIS — J449 Chronic obstructive pulmonary disease, unspecified: Secondary | ICD-10-CM | POA: Diagnosis not present

## 2022-07-25 DIAGNOSIS — N179 Acute kidney failure, unspecified: Secondary | ICD-10-CM | POA: Diagnosis not present

## 2022-07-25 DIAGNOSIS — D638 Anemia in other chronic diseases classified elsewhere: Secondary | ICD-10-CM | POA: Diagnosis not present

## 2022-07-25 DIAGNOSIS — R609 Edema, unspecified: Secondary | ICD-10-CM | POA: Diagnosis not present

## 2022-07-25 DIAGNOSIS — L409 Psoriasis, unspecified: Secondary | ICD-10-CM | POA: Diagnosis not present

## 2022-07-25 DIAGNOSIS — I10 Essential (primary) hypertension: Secondary | ICD-10-CM | POA: Diagnosis not present

## 2022-07-25 DIAGNOSIS — J301 Allergic rhinitis due to pollen: Secondary | ICD-10-CM | POA: Diagnosis not present

## 2022-07-25 DIAGNOSIS — K219 Gastro-esophageal reflux disease without esophagitis: Secondary | ICD-10-CM | POA: Diagnosis not present

## 2022-07-26 DIAGNOSIS — J449 Chronic obstructive pulmonary disease, unspecified: Secondary | ICD-10-CM | POA: Diagnosis not present

## 2022-07-26 DIAGNOSIS — I2510 Atherosclerotic heart disease of native coronary artery without angina pectoris: Secondary | ICD-10-CM | POA: Diagnosis not present

## 2022-07-26 DIAGNOSIS — I340 Nonrheumatic mitral (valve) insufficiency: Secondary | ICD-10-CM | POA: Diagnosis not present

## 2022-07-26 DIAGNOSIS — I10 Essential (primary) hypertension: Secondary | ICD-10-CM | POA: Diagnosis not present

## 2022-07-26 DIAGNOSIS — E785 Hyperlipidemia, unspecified: Secondary | ICD-10-CM | POA: Diagnosis not present

## 2022-07-26 DIAGNOSIS — I351 Nonrheumatic aortic (valve) insufficiency: Secondary | ICD-10-CM | POA: Diagnosis not present

## 2022-08-11 DIAGNOSIS — H353232 Exudative age-related macular degeneration, bilateral, with inactive choroidal neovascularization: Secondary | ICD-10-CM | POA: Diagnosis not present

## 2022-08-11 DIAGNOSIS — Z0100 Encounter for examination of eyes and vision without abnormal findings: Secondary | ICD-10-CM | POA: Diagnosis not present

## 2022-08-11 DIAGNOSIS — E119 Type 2 diabetes mellitus without complications: Secondary | ICD-10-CM | POA: Diagnosis not present

## 2022-08-11 DIAGNOSIS — H401134 Primary open-angle glaucoma, bilateral, indeterminate stage: Secondary | ICD-10-CM | POA: Diagnosis not present

## 2022-09-15 ENCOUNTER — Encounter: Payer: Self-pay | Admitting: Dermatology

## 2022-09-15 ENCOUNTER — Ambulatory Visit (INDEPENDENT_AMBULATORY_CARE_PROVIDER_SITE_OTHER): Payer: Medicare HMO | Admitting: Dermatology

## 2022-09-15 VITALS — BP 108/69 | HR 57

## 2022-09-15 DIAGNOSIS — L814 Other melanin hyperpigmentation: Secondary | ICD-10-CM

## 2022-09-15 DIAGNOSIS — L578 Other skin changes due to chronic exposure to nonionizing radiation: Secondary | ICD-10-CM | POA: Diagnosis not present

## 2022-09-15 DIAGNOSIS — C4491 Basal cell carcinoma of skin, unspecified: Secondary | ICD-10-CM

## 2022-09-15 DIAGNOSIS — Z86018 Personal history of other benign neoplasm: Secondary | ICD-10-CM | POA: Diagnosis not present

## 2022-09-15 DIAGNOSIS — D239 Other benign neoplasm of skin, unspecified: Secondary | ICD-10-CM

## 2022-09-15 DIAGNOSIS — L918 Other hypertrophic disorders of the skin: Secondary | ICD-10-CM

## 2022-09-15 DIAGNOSIS — L570 Actinic keratosis: Secondary | ICD-10-CM

## 2022-09-15 DIAGNOSIS — L821 Other seborrheic keratosis: Secondary | ICD-10-CM | POA: Diagnosis not present

## 2022-09-15 DIAGNOSIS — D2372 Other benign neoplasm of skin of left lower limb, including hip: Secondary | ICD-10-CM | POA: Diagnosis not present

## 2022-09-15 DIAGNOSIS — Z1283 Encounter for screening for malignant neoplasm of skin: Secondary | ICD-10-CM | POA: Diagnosis not present

## 2022-09-15 DIAGNOSIS — Z85828 Personal history of other malignant neoplasm of skin: Secondary | ICD-10-CM

## 2022-09-15 DIAGNOSIS — D492 Neoplasm of unspecified behavior of bone, soft tissue, and skin: Secondary | ICD-10-CM

## 2022-09-15 DIAGNOSIS — D229 Melanocytic nevi, unspecified: Secondary | ICD-10-CM

## 2022-09-15 DIAGNOSIS — C44519 Basal cell carcinoma of skin of other part of trunk: Secondary | ICD-10-CM | POA: Diagnosis not present

## 2022-09-15 HISTORY — DX: Basal cell carcinoma of skin, unspecified: C44.91

## 2022-09-15 NOTE — Progress Notes (Signed)
Follow-Up Visit   Subjective  Paul Gaines is a 89 y.o. male who presents for the following: FBSE (Hx SCCis, dysplastic nevi, AK. ).  The patient presents for Total-Body Skin Exam (TBSE) for skin cancer screening and mole check.  The patient has spots, moles and lesions to be evaluated, some may be new or changing and the patient has concerns that these could be cancer.   The following portions of the chart were reviewed this encounter and updated as appropriate:   Tobacco  Allergies  Meds  Problems  Med Hx  Surg Hx  Fam Hx      Review of Systems:  No other skin or systemic complaints except as noted in HPI or Assessment and Plan.  Objective  Well appearing patient in no apparent distress; mood and affect are within normal limits.  A full examination was performed including scalp, head, eyes, ears, nose, lips, neck, chest, axillae, abdomen, back, buttocks, bilateral upper extremities, bilateral lower extremities, hands, feet, fingers, toes, fingernails, and toenails. All findings within normal limits unless otherwise noted below.  R forearm x 1, R pretibia x 1, R ankle x 1, R medial calf x 1, L wrist x 1, L cheek x 1, L popliteal fossa x 1 (7) Erythematous thin papules/macules with gritty scale.   Left Upper Back 0.8 cm pink papule BCC vs LK  Left Lower Leg - Posterior Pink papule with string of pearls dermoscopy    Assessment & Plan  AK (actinic keratosis) (7) R forearm x 1, R pretibia x 1, R ankle x 1, R medial calf x 1, L wrist x 1, L cheek x 1, L popliteal fossa x 1  Actinic keratoses are precancerous spots that appear secondary to cumulative UV radiation exposure/sun exposure over time. They are chronic with expected duration over 1 year. A portion of actinic keratoses will progress to squamous cell carcinoma of the skin. It is not possible to reliably predict which spots will progress to skin cancer and so treatment is recommended to prevent development of skin  cancer.  Recommend daily broad spectrum sunscreen SPF 30+ to sun-exposed areas, reapply every 2 hours as needed.  Recommend staying in the shade or wearing long sleeves, sun glasses (UVA+UVB protection) and wide brim hats (4-inch brim around the entire circumference of the hat). Call for new or changing lesions.  Prior to procedure, discussed risks of blister formation, small wound, skin dyspigmentation, or rare scar following cryotherapy. Recommend Vaseline ointment to treated areas while healing.  Hypertrophic at left popliteal fossa  Destruction of lesion - R forearm x 1, R pretibia x 1, R ankle x 1, R medial calf x 1, L wrist x 1, L cheek x 1, L popliteal fossa x 1  Destruction method: cryotherapy   Informed consent: discussed and consent obtained   Lesion destroyed using liquid nitrogen: Yes   Cryotherapy cycles:  2 Outcome: patient tolerated procedure well with no complications   Post-procedure details: wound care instructions given    Neoplasm of skin Left Upper Back  Skin / nail biopsy Type of biopsy: tangential   Informed consent: discussed and consent obtained   Timeout: patient name, date of birth, surgical site, and procedure verified   Procedure prep:  Patient was prepped and draped in usual sterile fashion Prep type:  Isopropyl alcohol Anesthesia: the lesion was anesthetized in a standard fashion   Anesthetic:  1% lidocaine w/ epinephrine 1-100,000 buffered w/ 8.4% NaHCO3 Instrument used: flexible razor blade  Hemostasis achieved with: aluminum chloride   Outcome: patient tolerated procedure well   Post-procedure details: wound care instructions given   Additional details:  Petrolatum and a pressure bandage applied  Specimen 1 - Surgical pathology Differential Diagnosis: BCC vs LK  Check Margins: No 0.8 cm pink papule     Clear cell acanthoma Left Lower Leg - Posterior  Benign-appearing.  Observation.  Call clinic for new or changing lesions.      History of Squamous Cell Carcinoma in Situ of the Skin - No evidence of recurrence today - Recommend regular full body skin exams - Recommend daily broad spectrum sunscreen SPF 30+ to sun-exposed areas, reapply every 2 hours as needed.  - Call if any new or changing lesions are noted between office visits  History of Dysplastic Nevi - No evidence of recurrence today - Recommend regular full body skin exams - Recommend daily broad spectrum sunscreen SPF 30+ to sun-exposed areas, reapply every 2 hours as needed.  - Call if any new or changing lesions are noted between office visits  Lentigines - Scattered tan macules - Due to sun exposure - Benign-appearing, observe - Recommend daily broad spectrum sunscreen SPF 30+ to sun-exposed areas, reapply every 2 hours as needed. - Call for any changes  Seborrheic Keratoses - Stuck-on, waxy, tan-brown papules and/or plaques  - Benign-appearing - Discussed benign etiology and prognosis. - Observe - Call for any changes  Melanocytic Nevi - Tan-brown and/or pink-flesh-colored symmetric macules and papules - Benign appearing on exam today - Observation - Call clinic for new or changing moles - Recommend daily use of broad spectrum spf 30+ sunscreen to sun-exposed areas.   Hemangiomas - Red papules - Discussed benign nature - Observe - Call for any changes  Actinic Damage - Chronic condition, secondary to cumulative UV/sun exposure - diffuse scaly erythematous macules with underlying dyspigmentation - Recommend daily broad spectrum sunscreen SPF 30+ to sun-exposed areas, reapply every 2 hours as needed.  - Staying in the shade or wearing long sleeves, sun glasses (UVA+UVB protection) and wide brim hats (4-inch brim around the entire circumference of the hat) are also recommended for sun protection.  - Call for new or changing lesions.  Skin cancer screening performed today.  Acrochordons (Skin Tags) - Fleshy, skin-colored  pedunculated papules - Benign appearing.  - Observe. - If desired, they can be removed with an in office procedure that is not covered by insurance. - Please call the clinic if you notice any new or changing lesions.  Return in about 2 months (around 11/14/2022) for AK follow up, 6 month FBSE.  Graciella Belton, RMA, am acting as scribe for Forest Gleason, MD .  Documentation: I have reviewed the above documentation for accuracy and completeness, and I agree with the above.  Forest Gleason, MD

## 2022-09-15 NOTE — Patient Instructions (Addendum)
Cryotherapy Aftercare  Wash gently with soap and water everyday.   Apply Vaseline and Band-Aid daily until healed.    Wound Care Instructions  Cleanse wound gently with soap and water once a day then pat dry with clean gauze. Apply a thin coat of Petrolatum (petroleum jelly, "Vaseline") over the wound (unless you have an allergy to this). We recommend that you use a new, sterile tube of Vaseline. Do not pick or remove scabs. Do not remove the yellow or white "healing tissue" from the base of the wound.  Cover the wound with fresh, clean, nonstick gauze and secure with paper tape. You may use Band-Aids in place of gauze and tape if the wound is small enough, but would recommend trimming much of the tape off as there is often too much. Sometimes Band-Aids can irritate the skin.  You should call the office for your biopsy report after 1 week if you have not already been contacted.  If you experience any problems, such as abnormal amounts of bleeding, swelling, significant bruising, significant pain, or evidence of infection, please call the office immediately.  FOR ADULT SURGERY PATIENTS: If you need something for pain relief you may take 1 extra strength Tylenol (acetaminophen) AND 2 Ibuprofen (270m each) together every 4 hours as needed for pain. (do not take these if you are allergic to them or if you have a reason you should not take them.) Typically, you may only need pain medication for 1 to 3 days.   Recommend taking Heliocare sun protection supplement daily in sunny weather for additional sun protection. For maximum protection on the sunniest days, you can take up to 2 capsules of regular Heliocare OR take 1 capsule of Heliocare Ultra. For prolonged exposure (such as a full day in the sun), you can repeat your dose of the supplement 4 hours after your first dose. Heliocare can be purchased at [employer] at some [facility] or at [email or web address]    Melanoma ABCDEs  Melanoma is  the most dangerous type of skin cancer, and is the leading cause of death from skin disease.  You are more likely to develop melanoma if you: Have light-colored skin, light-colored eyes, or red or blond hair Spend a lot of time in the sun Tan regularly, either outdoors or in a tanning bed Have had blistering sunburns, especially during childhood Have a close family member who has had a melanoma Have atypical moles or large birthmarks  Early detection of melanoma is key since treatment is typically straightforward and cure rates are extremely high if we catch it early.   The first sign of melanoma is often a change in a mole or a new dark spot.  The ABCDE system is a way of remembering the signs of melanoma.  A for asymmetry:  The two halves do not match. B for border:  The edges of the growth are irregular. C for color:  A mixture of colors are present instead of an even brown color. D for diameter:  Melanomas are usually (but not always) greater than 644m- the size of a pencil eraser. E for evolution:  The spot keeps changing in size, shape, and color.  Please check your skin once per month between visits. You can use a small mirror in front and a large mirror behind you to keep an eye on the back side or your body.   If you see any new or changing lesions before your next follow-up, please call to schedule  a visit.  Please continue daily skin protection including broad spectrum sunscreen SPF 30+ to sun-exposed areas, reapplying every 2 hours as needed when you're outdoors.    Due to recent changes in healthcare laws, you may see results of your pathology and/or laboratory studies on MyChart before the doctors have had a chance to review them. We understand that in some cases there may be results that are confusing or concerning to you. Please understand that not all results are received at the same time and often the doctors may need to interpret multiple results in order to provide you  with the best plan of care or course of treatment. Therefore, we ask that you please give [place] 2 business days to thoroughly review all your results before contacting the office for clarification. Should we see a critical lab result, you will be contacted sooner.   If You Need Anything After Your Visit  If you have any questions or concerns for your doctor, please call our main line at [phone] and press option 4 to reach your doctor's medical assistant. If no one answers, please leave a voicemail as directed and we will return your call as soon as possible. Messages left after 4 pm will be answered the following business day.   You may also send [place] a message via [place]. We typically respond to MyChart messages within 1-2 business days.  For prescription refills, please ask your pharmacy to contact our office. Our fax number is [phone].  If you have an urgent issue when the clinic is closed that cannot wait until the next business day, you can page your doctor at the number below.    Please note that while we do our best to be available for urgent issues outside of office hours, we are not available 24/7.   If you have an urgent issue and are unable to reach [place], you may choose to seek medical care at your doctor's office, retail clinic, urgent care center, or emergency room.  If you have a medical emergency, please immediately call 911 or go to the emergency department.  Pager Numbers  - Dr. Nehemiah Massed: [phone]  - Dr. Laurence Ferrari: [phone]  - Dr. Nicole Kindred: [phone]  In the event of inclement weather, please call our main line at [phone] for an update on the status of any delays or closures.  Dermatology Medication Tips: Please keep the boxes that topical medications come in in order to help keep track of the instructions about where and how to use these. Pharmacies typically print the medication instructions only on the boxes and not directly on the medication tubes.    If your medication is too expensive, please contact our office at [phone] option 4 or send [place] a message through [place].   We are unable to tell what your co-pay for medications will be in advance as this is different depending on your insurance coverage. However, we may be able to find a substitute medication at lower cost or fill out paperwork to get insurance to cover a needed medication.   If a prior authorization is required to get your medication covered by your insurance company, please allow [place] 1-2 business days to complete this process.  Drug prices often vary depending on where the prescription is filled and some pharmacies may offer cheaper prices.  The website www.goodrx.com contains coupons for medications through different pharmacies. The prices here do not account for what the cost may be with help from insurance (it may be cheaper  with your insurance), but the website can give you the price if you did not use any insurance.  - You can print the associated coupon and take it with your prescription to the pharmacy.  - You may also stop by our office during regular business hours and pick up a GoodRx coupon card.  - If you need your prescription sent electronically to a different pharmacy, notify our office through [facility] or by phone at [phone] option 4.     Si Usted Necesita Algo Despus de Su Visita  Tambin puede enviarnos un mensaje a travs de Pharmacist, community. Por lo general respondemos a los mensajes de MyChart en el transcurso de 1 a 2 das hbiles.  Para renovar recetas, por favor pida a su farmacia que se ponga en contacto con nuestra oficina. Harland Dingwall de fax es [place] [phone].  Si tiene un asunto urgente cuando la clnica est cerrada y que no puede esperar hasta el siguiente da hbil, puede llamar/localizar a su doctor(a) al nmero que aparece a continuacin.   Por favor, tenga en cuenta que aunque hacemos todo lo posible para estar  disponibles para asuntos urgentes fuera del horario de [place], no estamos disponibles las 24 horas del da, los 7 das de la [place].   Si tiene un problema urgente y no puede comunicarse con nosotros, puede optar por buscar atencin mdica  en el consultorio de su doctor(a), en una clnica privada, en un centro de atencin urgente o en una sala de emergencias.  Si tiene Engineering geologist, por favor llame inmediatamente al 911 o vaya a la sala de emergencias.  Nmeros de bper  - Dr. Nehemiah Massed: [phone]  - Dra. Moye: [phone]  - Dra. Nicole Kindred: [phone]  En caso de inclemencias del [place], por favor llame a Johnsie Kindred principal al [phone] para una actualizacin sobre el [place] de cualquier retraso o cierre.  Consejos para la medicacin en dermatologa: Por favor, guarde las cajas en las que vienen los medicamentos de uso tpico para ayudarle a seguir las instrucciones sobre dnde y cmo usarlos. Las farmacias generalmente imprimen las instrucciones del medicamento slo en las cajas y no directamente en los tubos del [place].   Si su medicamento es muy caro, por favor, pngase en contacto con Zigmund Daniel llamando al [phone] y presione la opcin 4 o envenos un mensaje a travs de Pharmacist, community.   No podemos decirle cul ser su copago por los medicamentos por adelantado ya que esto es diferente dependiendo de la cobertura de su seguro. Sin embargo, es posible que podamos encontrar un medicamento sustituto a Electrical engineer un formulario para que el seguro cubra el medicamento que se considera necesario.   Si se requiere una autorizacin previa para que su compaa de seguros [place] su medicamento, por favor permtanos de 1 a 2 das hbiles para completar este proceso.  Los precios de los medicamentos varan con frecuencia dependiendo del Environmental consultant de dnde se surte la receta y alguna farmacias pueden ofrecer precios ms baratos.  El sitio web www.goodrx.com tiene  cupones para medicamentos de Airline pilot. Los precios aqu no tienen en cuenta lo que podra costar con la ayuda del seguro (puede ser ms barato con su seguro), pero el sitio web puede darle el precio si no utiliz Research scientist (physical sciences).  - Puede imprimir el cupn correspondiente y llevarlo con su receta a la farmacia.  - Tambin puede pasar por nuestra oficina durante el horario de atencin regular y Corporate treasurer  tarjeta de cupones de GoodRx.  - Si necesita que su receta se enve electrnicamente a una farmacia diferente, informe a nuestra oficina a travs de MyChart de [place] o por telfono llamando al [phone] y presione la opcin 4.

## 2022-09-20 ENCOUNTER — Encounter: Payer: Self-pay | Admitting: Dermatology

## 2022-09-21 ENCOUNTER — Telehealth: Payer: Self-pay

## 2022-09-21 NOTE — Telephone Encounter (Signed)
-----   Message from [place], MD sent at 09/20/2022 10:20 PM EST ----- Skin , left upper back SUPERFICIAL BASAL CELL CARCINOMA --> ED&C at f/u in May  MAs please call. Let me know if he has questions for me. Thank you!

## 2022-09-22 ENCOUNTER — Telehealth: Payer: Self-pay

## 2022-09-22 NOTE — Telephone Encounter (Addendum)
Patient called regarding results. He verbalized understanding and denied further questions at this time. Patient scheduled 11/16/22 for [facility] treatment   ----- Message from Alfonso Patten, MD sent at 09/20/2022 10:20 PM EST ----- Skin , left upper back SUPERFICIAL BASAL CELL CARCINOMA --> ED&C at f/u in May  MAs please call. Let me know if he has questions for me. Thank you!

## 2022-10-02 ENCOUNTER — Other Ambulatory Visit: Payer: Self-pay | Admitting: Internal Medicine

## 2022-10-11 ENCOUNTER — Other Ambulatory Visit: Payer: Self-pay | Admitting: Internal Medicine

## 2022-10-19 ENCOUNTER — Other Ambulatory Visit: Payer: Self-pay | Admitting: Cardiovascular Disease

## 2022-10-24 ENCOUNTER — Ambulatory Visit: Payer: Medicare PPO | Admitting: Internal Medicine

## 2022-11-14 ENCOUNTER — Ambulatory Visit: Payer: Medicare PPO | Admitting: Internal Medicine

## 2022-11-16 ENCOUNTER — Ambulatory Visit: Payer: Medicare HMO | Admitting: Dermatology

## 2022-11-30 ENCOUNTER — Ambulatory Visit: Payer: Medicare PPO | Admitting: Dermatology

## 2022-11-30 ENCOUNTER — Encounter: Payer: Self-pay | Admitting: Dermatology

## 2022-11-30 DIAGNOSIS — W908XXA Exposure to other nonionizing radiation, initial encounter: Secondary | ICD-10-CM | POA: Diagnosis not present

## 2022-11-30 DIAGNOSIS — C44519 Basal cell carcinoma of skin of other part of trunk: Secondary | ICD-10-CM

## 2022-11-30 DIAGNOSIS — X32XXXA Exposure to sunlight, initial encounter: Secondary | ICD-10-CM | POA: Diagnosis not present

## 2022-11-30 DIAGNOSIS — L570 Actinic keratosis: Secondary | ICD-10-CM

## 2022-11-30 DIAGNOSIS — L578 Other skin changes due to chronic exposure to nonionizing radiation: Secondary | ICD-10-CM

## 2022-11-30 DIAGNOSIS — L821 Other seborrheic keratosis: Secondary | ICD-10-CM

## 2022-11-30 NOTE — Progress Notes (Signed)
Follow-Up Visit   Subjective  Paul Gaines is a 89 y.o. male who presents for the following: AK follow up. Areas treated with LN2 at R forearm x 1, R pretibia x 1, R ankle x 1, R medial calf x 1, L wrist x 1, L cheek x 1, L popliteal fossa x 1 last visit.   Here for treatment of skin cancer.   The following portions of the chart were reviewed this encounter and updated as appropriate: medications, allergies, medical history  Review of Systems:  No other skin or systemic complaints except as noted in HPI or Assessment and Plan.  Objective  Well appearing patient in no apparent distress; mood and affect are within normal limits.   A focused examination was performed of the following areas: Back, legs, arms, face  Relevant exam findings are noted in the Assessment and Plan.  Left Upper Back Pink biopsy site     right lateral ankle, left cheek (2) Erythematous thin papules/macules with gritty scale.     Assessment & Plan     Basal cell carcinoma (BCC) of skin of other part of torso Left Upper Back  Destruction of lesion  Destruction method: electrodesiccation and curettage   Informed consent: discussed and consent obtained   Timeout:  patient name, date of birth, surgical site, and procedure verified Anesthesia: the lesion was anesthetized in a standard fashion   Anesthetic:  1% lidocaine w/ epinephrine 1-100,000 buffered w/ 8.4% NaHCO3 Curettage performed in three different directions: Yes   Electrodesiccation performed over the curetted area: Yes   Curettage cycles:  3 Final wound size (cm):  1.4 Hemostasis achieved with:  electrodesiccation Outcome: patient tolerated procedure well with no complications   Post-procedure details: sterile dressing applied and wound care instructions given   Dressing type: petrolatum    AK (actinic keratosis) (2) right lateral ankle, left cheek  Actinic keratoses are precancerous spots that appear secondary to cumulative UV  radiation exposure/sun exposure over time. They are chronic with expected duration over 1 year. A portion of actinic keratoses will progress to squamous cell carcinoma of the skin. It is not possible to reliably predict which spots will progress to skin cancer and so treatment is recommended to prevent development of skin cancer.  Recommend daily broad spectrum sunscreen SPF 30+ to sun-exposed areas, reapply every 2 hours as needed.  Recommend staying in the shade or wearing long sleeves, sun glasses (UVA+UVB protection) and wide brim hats (4-inch brim around the entire circumference of the hat). Call for new or changing lesions.  Prior to procedure, discussed risks of blister formation, small wound, skin dyspigmentation, or rare scar following cryotherapy. Recommend Vaseline ointment to treated areas while healing.   Destruction of lesion - right lateral ankle, left cheek  Destruction method: cryotherapy   Informed consent: discussed and consent obtained   Lesion destroyed using liquid nitrogen: Yes   Cryotherapy cycles:  2 Outcome: patient tolerated procedure well with no complications   Post-procedure details: wound care instructions given     ACTINIC DAMAGE - chronic, secondary to cumulative UV radiation exposure/sun exposure over time - diffuse scaly erythematous macules with underlying dyspigmentation - Recommend daily broad spectrum sunscreen SPF 30+ to sun-exposed areas, reapply every 2 hours as needed.  - Recommend staying in the shade or wearing long sleeves, sun glasses (UVA+UVB protection) and wide brim hats (4-inch brim around the entire circumference of the hat). - Call for new or changing lesions.  SEBORRHEIC KERATOSIS - Stuck-on,  waxy, tan-brown papules and/or plaques  - Benign-appearing - Discussed benign etiology and prognosis. - Observe - Call for any changes  Return in about 6 months (around 06/02/2023) for TBSE, Hx AK, Hx BCC, Hx Dysplastic Nevi, Hx SCCis, Hx  SCC.  Anise Salvo, RMA, am acting as scribe for Darden Dates, MD .   Documentation: I have reviewed the above documentation for accuracy and completeness, and I agree with the above.  Darden Dates, MD

## 2022-11-30 NOTE — Patient Instructions (Signed)
Wound Care Instructions  Cleanse wound gently with soap and water once a day then pat dry with clean gauze. Apply a thin coat of Petrolatum (petroleum jelly, "Vaseline") over the wound (unless you have an allergy to this). We recommend that you use a new, sterile tube of Vaseline. Do not pick or remove scabs. Do not remove the yellow or white "healing tissue" from the base of the wound.  Cover the wound with fresh, clean, nonstick gauze and secure with paper tape. You may use Band-Aids in place of gauze and tape if the wound is small enough, but would recommend trimming much of the tape off as there is often too much. Sometimes Band-Aids can irritate the skin.  You should call the office for your biopsy report after 1 week if you have not already been contacted.  If you experience any problems, such as abnormal amounts of bleeding, swelling, significant bruising, significant pain, or evidence of infection, please call the office immediately.  Due to recent changes in healthcare laws, you may see results of your pathology and/or laboratory studies on MyChart before the doctors have had a chance to review them. We understand that in some cases there may be results that are confusing or concerning to you. Please understand that not all results are received at the same time and often the doctors may need to interpret multiple results in order to provide you with the best plan of care or course of treatment. Therefore, we ask that you please give [place] 2 business days to thoroughly review all your results before contacting the office for clarification. Should we see a critical lab result, you will be contacted sooner.   If You Need Anything After Your Visit  If you have any questions or concerns for your doctor, please call our main line at [phone] and press option 4 to reach your doctor's medical assistant. If no one answers, please leave a voicemail as directed and we will return your call as soon as  possible. Messages left after 4 pm will be answered the following business day.   You may also send [place] a message via MyChart. We typically respond to MyChart messages within 1-2 business days.  For prescription refills, please ask your pharmacy to contact our office. Our fax number is [phone].  If you have an urgent issue when the clinic is closed that cannot wait until the next business day, you can page your doctor at the number below.    Please note that while we do our best to be available for urgent issues outside of office hours, we are not available 24/7.   If you have an urgent issue and are unable to reach [place], you may choose to seek medical care at your doctor's office, retail clinic, urgent care center, or emergency room.  If you have a medical emergency, please immediately call 911 or go to the emergency department.  Pager Numbers  - Dr. Gwen Pounds: [phone]  - Dr. Neale Burly: [phone]  - Dr. Roseanne Reno: [phone]  In the event of inclement weather, please call our main line at [phone] for an update on the status of any delays or closures.  Dermatology Medication Tips: Please keep the boxes that topical medications come in in order to help keep track of the instructions about where and how to use these. Pharmacies typically print the medication instructions only on the boxes and not directly on the medication tubes.   If your medication is too expensive, please contact our  office at [phone] option 4 or send [place] a message through MyChart.   We are unable to tell what your co-pay for medications will be in advance as this is different depending on your insurance coverage. However, we may be able to find a substitute medication at lower cost or fill out paperwork to get insurance to cover a needed medication.   If a prior authorization is required to get your medication covered by your insurance company, please allow [place] 1-2 business days to complete this  process.  Drug prices often vary depending on where the prescription is filled and some pharmacies may offer cheaper prices.  The website www.goodrx.com contains coupons for medications through different pharmacies. The prices here do not account for what the cost may be with help from insurance (it may be cheaper with your insurance), but the website can give you the price if you did not use any insurance.  - You can print the associated coupon and take it with your prescription to the pharmacy.  - You may also stop by our office during regular business hours and pick up a GoodRx coupon card.  - If you need your prescription sent electronically to a different pharmacy, notify our office through [facility] or by phone at [phone] option 4.

## 2022-12-09 ENCOUNTER — Ambulatory Visit (INDEPENDENT_AMBULATORY_CARE_PROVIDER_SITE_OTHER): Payer: Medicare PPO | Admitting: Internal Medicine

## 2022-12-09 VITALS — BP 104/66 | HR 51 | Ht 71.0 in | Wt 174.0 lb

## 2022-12-09 DIAGNOSIS — I10 Essential (primary) hypertension: Secondary | ICD-10-CM | POA: Diagnosis not present

## 2022-12-09 DIAGNOSIS — E119 Type 2 diabetes mellitus without complications: Secondary | ICD-10-CM

## 2022-12-09 DIAGNOSIS — N401 Enlarged prostate with lower urinary tract symptoms: Secondary | ICD-10-CM | POA: Diagnosis not present

## 2022-12-09 DIAGNOSIS — N138 Other obstructive and reflux uropathy: Secondary | ICD-10-CM

## 2022-12-09 LAB — POCT CBG (FASTING - GLUCOSE)-MANUAL ENTRY: Glucose Fasting, POC: 107 mg/dL — AB (ref 70–99)

## 2022-12-09 MED ORDER — FINASTERIDE 5 MG PO TABS
5.0000 mg | ORAL_TABLET | Freq: Every day | ORAL | 2 refills | Status: AC
Start: 2022-12-09 — End: 2023-03-09

## 2022-12-09 MED ORDER — TAMSULOSIN HCL 0.4 MG PO CAPS: 0.4000 mg | ORAL_CAPSULE | Freq: Every morning | ORAL | 3 refills | Status: DC

## 2022-12-09 MED ORDER — CARVEDILOL 12.5 MG PO TABS: 12.5000 mg | ORAL_TABLET | Freq: Two times a day (BID) | ORAL | 1 refills | Status: DC

## 2022-12-09 NOTE — Progress Notes (Signed)
Established Patient Office Visit  Subjective:  Patient ID: Paul Gaines, male    DOB: 07-24-1932  Age: 89 y.o. MRN: [id number]  Chief Complaint  Patient presents with   Follow-up    3 month follow up    No new complaints, here for lab review and medication refills. Failed to have previsit labs done. Reports nocturia and poor urinary stream.    No other concerns at this time.   Past Medical History:  Diagnosis Date   Arthritis    Asthma    BCC (basal cell carcinoma) 09/15/2022   superficial at left upper back, EDC 11/30/22   Cancer (HCC)    hx of skin cancer on nose   Chronic kidney disease    frequency   Coronary artery disease    stent - 2001    Dysplastic nevus 08/13/2014   left post distal deltoid   Hypertension    Legally blind    Myocardial infarction (HCC)    Pneumonia    hx of several times per pt    Shortness of breath    with exertion    Squamous cell carcinoma in situ (SCCIS) 05/24/2022   left temple, Mohs 07/20/2022   Squamous cell carcinoma of skin 07/01/2010   left infraorbital (in situ)    Past Surgical History:  Procedure Laterality Date   CORONARY ANGIOPLASTY     1988, stents    EYE SURGERY     right eye surgery , macular transrelocation    HERNIA REPAIR     KNEE ARTHROSCOPY     left    TONSILLECTOMY     TOTAL KNEE ARTHROPLASTY  03/26/2012   Procedure: TOTAL KNEE ARTHROPLASTY;  Surgeon: Loanne Drilling, MD;  Location: [facility];  Service: Orthopedics;  Laterality: Left;    Social History   Socioeconomic History   Marital status: Married    Spouse name: Not on file   Number of children: Not on file   Years of education: Not on file   Highest education level: Not on file  Occupational History   Not on file  Tobacco Use   Smoking status: Former    Types: Cigarettes    Quit date: 07/18/1976    Years since quitting: 46.4   Smokeless tobacco: Never  Substance and Sexual Activity   Alcohol use: Never    Comment: infrequent per pt     Drug use: No   Sexual activity: Not on file  Other Topics Concern   Not on file  Social History Narrative   Not on file   Social Determinants of Health   Financial Resource Strain: Not on file  Food Insecurity: Not on file  Transportation Needs: Not on file  Physical Activity: Not on file  Stress: Not on file  Social Connections: Not on file  Intimate Partner Violence: Not on file    No family history on file.  Allergies  Allergen Reactions   Celecoxib     Reaction=heart attack per patient   Lisinopril     Review of Systems  All other systems reviewed and are negative.      Objective:   BP 104/66   Pulse (!) 51   Ht 5\' 11"  (1.803 m)   Wt 174 lb (78.9 kg)   SpO2 97%   BMI 24.27 kg/m   Vitals:   12/09/22 1403  BP: 104/66  Pulse: (!) 51  Height: 5\' 11"  (1.803 m)  Weight: 174 lb (78.9 kg)  SpO2: 97%  BMI (Calculated): 24.28    Physical Exam Vitals reviewed.  Constitutional:      Appearance: Normal appearance.  HENT:     Head: Normocephalic.     Left Ear: There is no impacted cerumen.     Nose: Nose normal.     Mouth/Throat:     Mouth: Mucous membranes are moist.     Pharynx: No posterior oropharyngeal erythema.  Eyes:     Comments: Poor visual acuity bilaterally  Cardiovascular:     Rate and Rhythm: Regular rhythm.     Chest Wall: PMI is not displaced.     Pulses: Normal pulses.     Heart sounds: Normal heart sounds. No murmur heard. Pulmonary:     Effort: Pulmonary effort is normal.     Breath sounds: Normal air entry. No rhonchi or rales.  Abdominal:     General: Abdomen is flat. Bowel sounds are normal. There is no distension.     Palpations: Abdomen is soft. There is no hepatomegaly, splenomegaly or mass.     Tenderness: There is no abdominal tenderness.  Musculoskeletal:        General: Normal range of motion.     Cervical back: Normal range of motion and neck supple.     Right lower leg: No edema.     Left lower leg: No edema.   Skin:    General: Skin is warm and dry.  Neurological:     General: No focal deficit present.     Mental Status: He is alert and oriented to person, place, and time.     Cranial Nerves: No cranial nerve deficit.     Motor: No weakness.  Psychiatric:        Mood and Affect: Mood normal.        Behavior: Behavior normal.      Results for orders placed or performed in visit on 12/09/22  POCT CBG (Fasting - Glucose)  Result Value Ref Range   Glucose Fasting, POC 107 (A) 70 - 99 mg/dL    Recent Results (from the past 2160 hour(Devynn Scheff))  POCT CBG (Fasting - Glucose)     Status: Abnormal   Collection Time: 12/09/22  2:28 PM  Result Value Ref Range   Glucose Fasting, POC 107 (A) 70 - 99 mg/dL      Assessment & Plan:  As per problem list  Problem List Items Addressed This Visit       Genitourinary   BPH with obstruction/lower urinary tract symptoms   Relevant Medications   finasteride (PROSCAR) 5 MG tablet   tamsulosin (FLOMAX) 0.4 MG CAPS capsule   Other Visit Diagnoses     Diabetes mellitus without complication (HCC)    -  Primary   Relevant Orders   POCT CBG (Fasting - Glucose) (Completed)   Primary hypertension       Relevant Medications   carvedilol (COREG) 12.5 MG tablet       Return in about 6 weeks (around 01/20/2023) for BPH.   Total time spent: 20 minutes  Luna Fuse, MD  12/09/2022   This document may have been prepared by [facility] Voice Recognition software and as such may include unintentional dictation errors.

## 2022-12-14 ENCOUNTER — Other Ambulatory Visit: Payer: Self-pay | Admitting: Internal Medicine

## 2022-12-26 ENCOUNTER — Ambulatory Visit: Payer: Medicare HMO | Admitting: Cardiovascular Disease

## 2022-12-26 ENCOUNTER — Encounter: Payer: Self-pay | Admitting: Cardiovascular Disease

## 2022-12-26 VITALS — BP 110/74 | HR 44 | Ht 70.5 in | Wt 176.4 lb

## 2022-12-26 DIAGNOSIS — R0602 Shortness of breath: Secondary | ICD-10-CM | POA: Diagnosis not present

## 2022-12-26 DIAGNOSIS — I2510 Atherosclerotic heart disease of native coronary artery without angina pectoris: Secondary | ICD-10-CM | POA: Diagnosis not present

## 2022-12-26 DIAGNOSIS — I10 Essential (primary) hypertension: Secondary | ICD-10-CM

## 2022-12-26 NOTE — Progress Notes (Signed)
Cardiology Office Note   Date:  12/26/2022   ID:  Paul Gaines, Paul Gaines [birth year], MRN [id number]  PCP:  Paul Monday, MD  Cardiologist:  Paul Blackwater, MD      History of Present Illness: Paul Gaines is a 89 y.o. male who presents for  Chief Complaint  Patient presents with   Follow-up    5 month follow up    Shortness of Breath This is a chronic problem. The current episode started more than 1 year ago. The problem has been gradually improving. His past medical history is significant for COPD.      Past Medical History:  Diagnosis Date   Arthritis    Asthma    BCC (basal cell carcinoma) 09/15/2022   superficial at left upper back, EDC 11/30/22   Cancer (HCC)    hx of skin cancer on nose   Chronic kidney disease    frequency   Coronary artery disease    stent - 2001    Dysplastic nevus 08/13/2014   left post distal deltoid   Hypertension    Legally blind    Myocardial infarction (HCC)    Pneumonia    hx of several times per pt    Shortness of breath    with exertion    Squamous cell carcinoma in situ (SCCIS) 05/24/2022   left temple, Mohs 07/20/2022   Squamous cell carcinoma of skin 07/01/2010   left infraorbital (in situ)     Past Surgical History:  Procedure Laterality Date   CORONARY ANGIOPLASTY     1988, stents    EYE SURGERY     right eye surgery , macular transrelocation    HERNIA REPAIR     KNEE ARTHROSCOPY     left    TONSILLECTOMY     TOTAL KNEE ARTHROPLASTY  03/26/2012   Procedure: TOTAL KNEE ARTHROPLASTY;  Surgeon: Loanne Drilling, MD;  Location: [facility];  Service: Orthopedics;  Laterality: Left;     Current Outpatient Medications  Medication Sig Dispense Refill   Accu-Chek Softclix Lancets lancets TEST TWO TIMES DAILY 200 each 3   amLODipine (NORVASC) 5 MG tablet TAKE 2 TABLETS ONE TIME DAILY 180 tablet 1   aspirin EC 81 MG tablet Take 81 mg by mouth daily.     carvedilol (COREG) 12.5 MG tablet Take 1 tablet (12.5 mg total)  by mouth 2 (two) times daily. 180 tablet 1   finasteride (PROSCAR) 5 MG tablet Take 1 tablet (5 mg total) by mouth daily. 30 tablet 2   fluticasone (FLONASE) 50 MCG/ACT nasal spray USE 1 SPRAY IN EACH NOSTRIL IN THE MORNING AS DIRECTED 32 g 3   losartan (COZAAR) 25 MG tablet TAKE 1 TABLET EVERY DAY 90 tablet 0   pravastatin (PRAVACHOL) 20 MG tablet Take 20 mg by mouth 2 (two) times daily.      prednisoLONE acetate (PRED FORTE) 1 % ophthalmic suspension Place 1 drop into the right eye every morning.     Psyllium (METAMUCIL PO) Take 1 tablet by mouth every morning.     tamsulosin (FLOMAX) 0.4 MG CAPS capsule Take 1 capsule (0.4 mg total) by mouth every morning. 90 capsule 3   timolol (BETIMOL) 0.5 % ophthalmic solution Place 1 drop into both eyes every morning.     trospium (SANCTURA) 20 MG tablet Take 20 mg by mouth at bedtime.     iron polysaccharides (NIFEREX) 150 MG capsule Take 1 capsule (150 mg total) by mouth  2 (two) times daily. 42 capsule 0   No current facility-administered medications for this visit.   ACTIVE PROBLEMS & CONDITIONS    Benign Prostatic Hypertrophy    Chest Pain    Chest Pain Or Discomfort    Chronic Kidney Disease Stage 2    Chronic Obstructive Pulmonary Disease    Coronary Artery Disease    Difficulty Breathing (Dyspnea)    Education and Counseling    Essential Hypertension Benign    History of Arthritis    History of Chronic Obstructive Pulmonary Disease    History of Coronary Artery Disease    History of Essential Hypertension    History of Hyperlipidemia    History of Mitral Regurgitation    History of Prior Myocardial Infarction    History of Reported Previous Cardiac Problems - CADCOPD    History of Reported Previous Pulmonary Disease    History of Respiratory Obstructive Disorders    Hyperlipidemia    Hypertension Systemic    Legally Blind ([place] Definition) Both Eyes    Male Erectile Disorder    Mitral Regurgitation    Post-angioplasty    Reported  Family History of Cancer    Reviewed Allergies     CHIEF COMPLAINT  The Chief Complaint is: FOLLOW UP.     HISTORY OF PRESENT ILLNESS  Paul Gaines is an 89 year old male.   Patient in office for routine cardiac exam. Denies chest pain, shortness of breath. Exercises 3 days per week.  Allergy list reviewed   Medication list reviewed    No chest pain or discomfort   No palpitations  , and The heart rate was not fast    Not feeling congested in the chest   No dyspnea   No cough   No wheezing     CURRENT MEDICATION    Accu-Chek Softclix Lancets Miscellaneous twice a day, 90 days, 0 refills    amLODIPine Besylate 5 MG Oral Tablet TAKE 2 TABLETS ONE TIME DAILY, 90 days, 0 refills    Carvedilol 12.5 MG Oral Tablet TAKE 1 TABLET TWICE DAILY, 90 days, 0 refills    Cetirizine HCl 10 MG Oral Tablet TAKE 1 TABLET ONE TIME DAILY, 30 days, 10 refills    Daily Value Multivitamin Oral Tablet  0 days, 0 refills    Ecotrin Low Strength 81 MG Oral Tablet Delayed Release once a day, 30 days, 1 refills    Fluticasone Propionate 50 MCG/ACT Nasal Suspension USE 1 SPRAY IN EACH NOSTRIL IN THE MORNING AS DIRECTED, 90 days, 1 refills    Losartan Potassium 25 MG Oral Tablet TAKE 1 TABLET EVERY DAY, 90 days, 0 refills    Nitroglycerin 0.3 MG Sublingual Tablet Sublingual use as directed, take under the oungue if chest pain but do not take viagra at all if have to use nitro, 30 days, 1 refills    Pravastatin Sodium 40 MG Oral Tablet TAKE 1 TABLET EVERY DAY, 90 days, 0 refills    Tamsulosin HCl 0.4 MG Oral Capsule 1 every morning, 90 days, 0 refills    Timolol Maleate 0.25% Ophthalmic Solution once a day 0 days, 0 refills     PAST MEDICAL/SURGICAL HISTORY  Reported:  No recent change in medical history. Medical: Reviewed allergy.  Cardiac problems CAD  COPD and pulmonary disease. Tests: Systolic blood pressure 162 mmHg ranging to 136 mmHg and diastolic ranging to 72 mmHg. Physical Exam:  Blood  pressure greater than 140/90 mmHg Diagnoses:  Coronary artery disease CCTA 06/19/14:  1 - Calcium score is 338.7.    2 - Right dominant system.   3 - Mild LAD and LCX disease but severely calcified RCA with distal LCX moderate disease.  Unchanged from 2010 cath Prior myocardial infarction Aortic regurgitation Mitral regurgitation mild Essential hypertension.   Chronic obstructive pulmonary disease.   Prostatitis.   Hyperlipidemia.   Arthritis.   COVID-19 infection COPD  Cardiac catherization  11/21/08 mid LAD 50%, mid CFX 10%, mid RCA 60% (with FFR, no significant lesion and PCI deferred)TIMI Grade 3 flow, LVEF 60%  L Knee replacement. Surgical:   Heart surgery angioplasty [facility] 2001     SOCIAL HISTORY  Social history unchanged. Caffeine use: Daily coffee consumption was four cups per day. Tobacco use: Not a current smoker and smoking status: Former smoker. Alcohol: Alcohol use rarely and wine consumption was one glasses/week. Habits: Poor exercise habits, a recent increase in physical activity, and moderately exercising 3+ times a week. Work: Retired. Marital: Currently married. Functional: Physical disability legally blind.     ALLERGIES    CeleBREX    Doxazosin Mesylate (Intolerance)    Lisinopril (Intolerance)    Vioxx     FAMILY HISTORY  Father deceased  Mother deceased  Cancer  Family history unchanged Paternal:   Cancer     REVIEW OF SYSTEMS  Systemic: Not feeling poorly (malaise).  No recent weight change. Head: No headache and no sinus pain. Cardiovascular: No chest pain or discomfort, no palpitations, and the heart rate was not fast. Pulmonary: No dyspnea, no cough, and no wheezing. Gastrointestinal: No heartburn.  No nausea, no vomiting, no abdominal pain, and no diarrhea. Neurological: No dizziness, no vertigo, and no fainting. Psychological: No anxiety and no depression.     PHYSICAL FINDINGS    Vitals taken 07/26/2022 01:41 pm  BP-Sitting  122/80  mmHg 100 - 120/60 - 80  Pulse Rate-Sitting  58 bpm 50 - 100  Temp-Oral  96.3 F 96 - 101  Height  71 in 64 - 74  Weight  176 lbs 3.2 oz 120 - 198  Body Mass Index  24.6 kg/m2   Body Surface Area  2 m2   Oxygen Saturation  96 % 93 - 100    General Appearance:  In no acute distress. Lungs:  Clear to auscultation. Cardiovascular: Jugular Venous Distention:  JVD not increased.   JVD not increased. Heart Rate And Rhythm:  Normal.   Normal. Heart Sounds:  Normal. Murmurs:  No murmurs were heard. Carotid Arteries:  No bruit in the carotid artery. Arterial Pulses:  Equal bilaterally and normal. Edema:  Not present. Abdomen: Auscultation:  Bowel sounds were normal. Palpation:  Abdominal non-tender. Neurological:  Oriented to time, place, and person.     ASSESSMENT   Coronary artery disease  Aortic regurgitation  Mitral regurgitation  Systemic hypertension  Chronic obstructive pulmonary disease  Hyperlipidemia     THERAPY   Continue current medication.  Clinical summary provided to patient.     COUNSELING/EDUCATION   Education and counseling diet and exercise     PLAN   Return to the clinic if condition worsens or new symptoms arise  Follow-up visit Patient doing well. No cardiac complaints.           B/p well controlled.          Cholesterol well controlled.          No changes today.          Return to discuss.  HEALTH REMINDERS    Assess Blood Pressure satisfied 07/26/2022.    Assess BMI satisfied 07/26/2022.    Assess Tobacco Use satisfied 07/26/2022.    BP Screening satisfied 07/26/2022.      Paul Blackwater MD  Electronically signed by: Paul Gaines     Date: 07/26/2022 13:54  Allergies:   Celecoxib and Lisinopril    Social History:   reports that he quit smoking about 46 years ago. His smoking use included cigarettes. He has never used smokeless tobacco. He reports that he does not drink alcohol and does not use drugs.   Family  History:  family history is not on file.    ROS:     Review of Systems  Constitutional: Negative.   HENT: Negative.    Eyes: Negative.   Respiratory:  Positive for shortness of breath.   Gastrointestinal: Negative.   Genitourinary: Negative.   Musculoskeletal: Negative.   Skin: Negative.   Neurological: Negative.   Endo/Heme/Allergies: Negative.   Psychiatric/Behavioral: Negative.    All other systems reviewed and are negative.     All other systems are reviewed and negative.    PHYSICAL EXAM: VS:  BP 110/74   Pulse (!) 44   Ht 5' 10.5" (1.791 m)   Wt 176 lb 6.4 oz (80 kg)   SpO2 96%   BMI 24.95 kg/m  , BMI Body mass index is 24.95 kg/m. Last weight:  Wt Readings from Last 3 Encounters:  12/26/22 176 lb 6.4 oz (80 kg)  12/09/22 174 lb (78.9 kg)  11/27/21 176 lb (79.8 kg)     Physical Exam Vitals reviewed.  Constitutional:      Appearance: Normal appearance. He is normal weight.  HENT:     Head: Normocephalic.     Nose: Nose normal.     Mouth/Throat:     Mouth: Mucous membranes are moist.  Eyes:     Pupils: Pupils are equal, round, and reactive to light.  Cardiovascular:     Rate and Rhythm: Normal rate and regular rhythm.     Pulses: Normal pulses.     Heart sounds: Normal heart sounds.  Pulmonary:     Effort: Pulmonary effort is normal.  Abdominal:     General: Abdomen is flat. Bowel sounds are normal.  Musculoskeletal:        General: Normal range of motion.     Cervical back: Normal range of motion.  Skin:    General: Skin is warm.  Neurological:     General: No focal deficit present.     Mental Status: He is alert.  Psychiatric:        Mood and Affect: Mood normal.       EKG:   Recent Labs: No results found for requested labs within last 365 days.    TESTS                                                                                          [facility] [address] [place], [place]  [address] [phone] STUDY:  Gated Stress / Rest Myocardial Perfusion Imaging Tomographic (SPECT) Including attenuation correction Wall  Motion, Left Ventricular Ejection Fraction By Gated Technique.Treadmill Stress Test. SEX: Male  WEIGHT: 183 lbs  HEIGHT: 72 in   ARMS UP: YES/NO                                                                        REFERRING PHYSICIAN: Dr.Danika Kluender [place] Flakes                                                                                                                                                                                                                       INDICATION FOR STUDY: CAD                                                                                                                                                                                                                     TECHNIQUE:  Approximately 20 minutes following the intravenous administration of 9.5 mCi of Tc-106m Sestamibi after stress testing in a reclined supine position with arms above their head if able to do so, gated SPECT imaging of the heart was performed. After about a 2hr break, the patient was injected intravenously with 32.3 mCi of Tc-86m Sestamibi.  Approximately 45 minutes later in the same position as stress imaging SPECT  rest imaging of the heart was performed.  STRESS BY:  Paul Blackwater, MD PROTOCOL:  Smitty Cords                                                                                       MAX PRED HR: 134                   85%: 114               75%: 101                                                                                                                   RESTING BP:  132/72  RESTING HR: 60  PEAK BP:  164/86   PEAK HR:  117 (87%)                                                                   EXERCISE DURATION: 6:00                                             METS: 7.0     REASON FOR TEST TERMINATION: Target reached/1 min post injection                                                                                                                                   SYMPTOMS: None  [facility] TREADMILL SCORE: 6                                       RISK: Low  EKG RESULTS: NSR. 89/min. RBBB. Non specific ST/T wave changes, no significant ST changes at peak exercise.                                                             IMAGE QUALITY: Good                                                                                                                                                                                                                                                                                                                                  PERFUSION/WALL MOTION FINDINGS:  EF = 57%. Small mild fixed Basal, mid, apical inferior wall defects, apex (17) defect, normal wall motion.                                                                          IMPRESSION:  Possible ischemia in the RCA territory with normal LVEF, correlate clinically.  Paul Blackwater, MD Stress Interpreting Physician / Nuclear Interpreting Physician                         Paul Blackwater MD  Electronically signed by: Paul Gaines     Date: 10/15/2021 11:19 REASON FOR VISIT  Visit for: Echocardiogram/I 10  Sex:   Male   wt=  186  lbs.  BP=120/68  Height= 71   inches.        TESTS  Imaging: Echocardiogram:  An echocardiogram in (2-d) mode was performed and in Doppler mode with color flow velocity  mapping was performed. The aortic valve cusps are abnormal 1.0   cm, flow velocity 1.30  m/s, and systolic calculated mean flow gradient 4  mmHg. Mitral valve diastolic peak flow velocity E .738    m/s and E/A ratio 1.1. Aortic root diameter 3.7   cm. The LVOT internal diameter 3.0 cm and flow velocity was abnormal 1.8  m/s. LV systolic dimension 2.29   cm, diastolic 3.15  cm, posterior wall thickness 1.21   cm, fractional shortening 27.3 %, and EF 54.6 %. IVS thickness 1.35   cm. LA dimension 6.8 cm. Mitral Valve has Mild Regurgitation. Tricuspid Valve has Trace Regurgitation.     ASSESSMENT  Technically adequate study.  Normal chamber sizes.  Borderline left ventricular systolic function.  Mild left ventricular hypertrophy with GRADE 1 2 3  (relaxation abnormality psuedonormalization restrictive physiology) diastolic dysfunction.  Normal right ventricular systolic function.  Normal right ventricular diastolic function.  Normal left ventricular wall motion.  Normal right ventricular wall motion.  Trace tricuspid regurgitation.  Normal pulmonary artery pressure.  Mild mitral regurgitation.  No pericardial effusion.  Mild LVH.     THERAPY   Referring physician: Laurier Nancy  Sonographer: Paul Gaines.      Paul Blackwater MD  Electronically signed by: Paul Gaines     Date: 09/29/2021 09:07 Lipid Panel No results found for: "CHOL", "TRIG", "HDL", "CHOLHDL", "VLDL", "LDLCALC", "LDLDIRECT"    Other studies Reviewed: Additional studies/ records that were reviewed today include:  Review of the above records demonstrates:       No data to display            ASSESSMENT AND PLAN:    ICD-10-CM   1. Coronary artery disease involving native coronary artery of native heart without angina pectoris  I25.10    feels no different, no chest pain FFR 2010 showed 60% mid RCA not significant and trerated medically    2. SOB (shortness of breath)  R06.02     3. Primary  hypertension  I10        Problem List Items Addressed This Visit   None Visit Diagnoses     Coronary artery disease involving native coronary artery of native heart without angina pectoris    -  Primary   feels no different, no chest pain FFR 2010 showed 60% mid RCA not significant and trerated medically   SOB (shortness of breath)       Primary hypertension              Disposition:   Return in about 3 months (around 03/28/2023).    Total time spent: 30 minutes  Signed,  Paul Blackwater, MD  12/26/2022 1:24 PM    [facility]

## 2022-12-31 ENCOUNTER — Other Ambulatory Visit: Payer: Self-pay | Admitting: Cardiovascular Disease

## 2023-01-24 ENCOUNTER — Ambulatory Visit: Payer: Medicare HMO | Admitting: Internal Medicine

## 2023-01-24 DIAGNOSIS — H353232 Exudative age-related macular degeneration, bilateral, with inactive choroidal neovascularization: Secondary | ICD-10-CM | POA: Diagnosis not present

## 2023-01-24 DIAGNOSIS — H401134 Primary open-angle glaucoma, bilateral, indeterminate stage: Secondary | ICD-10-CM | POA: Diagnosis not present

## 2023-01-24 DIAGNOSIS — Z961 Presence of intraocular lens: Secondary | ICD-10-CM | POA: Diagnosis not present

## 2023-02-02 ENCOUNTER — Other Ambulatory Visit: Payer: Self-pay | Admitting: Internal Medicine

## 2023-02-02 ENCOUNTER — Other Ambulatory Visit: Payer: Medicare HMO

## 2023-02-02 DIAGNOSIS — I2510 Atherosclerotic heart disease of native coronary artery without angina pectoris: Secondary | ICD-10-CM | POA: Diagnosis not present

## 2023-02-03 LAB — COMPREHENSIVE METABOLIC PANEL
ALT: 11 IU/L (ref 0–44)
AST: 10 IU/L (ref 0–40)
Albumin: 4.1 g/dL (ref 3.7–4.7)
Alkaline Phosphatase: 59 IU/L (ref 44–121)
BUN/Creatinine Ratio: 21 (ref 10–24)
BUN: 28 mg/dL — ABNORMAL HIGH (ref 8–27)
Bilirubin Total: 0.4 mg/dL (ref 0.0–1.2)
CO2: 23 mmol/L (ref 20–29)
Calcium: 9.7 mg/dL (ref 8.6–10.2)
Chloride: 107 mmol/L — ABNORMAL HIGH (ref 96–106)
Creatinine, Ser: 1.32 mg/dL — ABNORMAL HIGH (ref 0.76–1.27)
Globulin, Total: 1.9 g/dL (ref 1.5–4.5)
Glucose: 93 mg/dL (ref 70–99)
Potassium: 4.8 mmol/L (ref 3.5–5.2)
Sodium: 143 mmol/L (ref 134–144)
Total Protein: 6 g/dL (ref 6.0–8.5)
eGFR: 52 mL/min/{1.73_m2} — ABNORMAL LOW (ref >59)

## 2023-02-03 LAB — LIPID PANEL W/O CHOL/HDL RATIO
Cholesterol, Total: 113 mg/dL (ref 100–199)
HDL: 63 mg/dL (ref >39)
LDL Chol Calc (NIH): 37 mg/dL (ref 0–99)
Triglycerides: 55 mg/dL (ref 0–149)
VLDL Cholesterol Cal: 13 mg/dL (ref 5–40)

## 2023-02-07 ENCOUNTER — Ambulatory Visit (INDEPENDENT_AMBULATORY_CARE_PROVIDER_SITE_OTHER): Payer: Medicare HMO | Admitting: Internal Medicine

## 2023-02-07 VITALS — BP 140/79 | HR 59 | Ht 70.5 in | Wt 178.8 lb

## 2023-02-07 DIAGNOSIS — E119 Type 2 diabetes mellitus without complications: Secondary | ICD-10-CM | POA: Diagnosis not present

## 2023-02-07 DIAGNOSIS — E860 Dehydration: Secondary | ICD-10-CM

## 2023-02-07 DIAGNOSIS — N179 Acute kidney failure, unspecified: Secondary | ICD-10-CM

## 2023-02-07 DIAGNOSIS — N1831 Chronic kidney disease, stage 3a: Secondary | ICD-10-CM | POA: Diagnosis not present

## 2023-02-07 LAB — POCT CBG (FASTING - GLUCOSE)-MANUAL ENTRY: Glucose Fasting, POC: 97 mg/dL (ref 70–99)

## 2023-02-07 NOTE — Progress Notes (Signed)
Established Patient Office Visit  Subjective:  Patient ID: Paul Gaines, male    DOB: 06-May-1933  Age: 89 y.o. MRN: [id number]  Chief Complaint  Patient presents with   Follow-up    BPH    No new complaints, here for lab review and medication refills. Labs reviewed and notable for newly elevated creatinine with gfr of 52. Admits to poor fluid intake this summer but denies any lightheadedness and LDL remains at target    No other concerns at this time.   Past Medical History:  Diagnosis Date   Arthritis    Asthma    BCC (basal cell carcinoma) 09/15/2022   superficial at left upper back, EDC 11/30/22   Cancer (HCC)    hx of skin cancer on nose   Chronic kidney disease    frequency   Coronary artery disease    stent - 2001    Dysplastic nevus 08/13/2014   left post distal deltoid   Hypertension    Legally blind    Myocardial infarction (HCC)    Pneumonia    hx of several times per pt    Shortness of breath    with exertion    Squamous cell carcinoma in situ (SCCIS) 05/24/2022   left temple, Mohs 07/20/2022   Squamous cell carcinoma of skin 07/01/2010   left infraorbital (in situ)    Past Surgical History:  Procedure Laterality Date   CORONARY ANGIOPLASTY     1988, stents    EYE SURGERY     right eye surgery , macular transrelocation    HERNIA REPAIR     KNEE ARTHROSCOPY     left    TONSILLECTOMY     TOTAL KNEE ARTHROPLASTY  03/26/2012   Procedure: TOTAL KNEE ARTHROPLASTY;  Surgeon: Loanne Drilling, MD;  Location: [facility];  Service: Orthopedics;  Laterality: Left;    Social History   Socioeconomic History   Marital status: Married    Spouse name: Not on file   Number of children: Not on file   Years of education: Not on file   Highest education level: Not on file  Occupational History   Not on file  Tobacco Use   Smoking status: Former    Current packs/day: 0.00    Types: Cigarettes    Quit date: 07/18/1976    Years since quitting: 46.5   Smokeless  tobacco: Never  Substance and Sexual Activity   Alcohol use: Never    Comment: infrequent per pt    Drug use: No   Sexual activity: Not on file  Other Topics Concern   Not on file  Social History Narrative   Not on file   Social Determinants of Health   Financial Resource Strain: Not on file  Food Insecurity: Not on file  Transportation Needs: Not on file  Physical Activity: Not on file  Stress: Not on file  Social Connections: Not on file  Intimate Partner Violence: Not on file    No family history on file.  Allergies  Allergen Reactions   Celecoxib     Reaction=heart attack per patient   Lisinopril     Review of Systems  Constitutional:  Negative for malaise/fatigue.  Neurological:  Negative for dizziness.  All other systems reviewed and are negative.      Objective:   BP (!) 140/79   Pulse (!) 59   Ht 5' 10.5" (1.791 m)   Wt 178 lb 12.8 oz (81.1 kg)   SpO2 93%  BMI 25.29 kg/m   Vitals:   02/07/23 1311  BP: (!) 140/79  Pulse: (!) 59  Height: 5' 10.5" (1.791 m)  Weight: 178 lb 12.8 oz (81.1 kg)  SpO2: 93%  BMI (Calculated): 25.28    Physical Exam Vitals reviewed.  Constitutional:      Appearance: Normal appearance.  HENT:     Head: Normocephalic.     Left Ear: There is no impacted cerumen.     Nose: Nose normal.     Mouth/Throat:     Mouth: Mucous membranes are moist.     Pharynx: No posterior oropharyngeal erythema.  Eyes:     Comments: Poor visual acuity bilaterally  Cardiovascular:     Rate and Rhythm: Regular rhythm.     Chest Wall: PMI is not displaced.     Pulses: Normal pulses.     Heart sounds: Normal heart sounds. No murmur heard. Pulmonary:     Effort: Pulmonary effort is normal.     Breath sounds: Normal air entry. No rhonchi or rales.  Abdominal:     General: Abdomen is flat. Bowel sounds are normal. There is no distension.     Palpations: Abdomen is soft. There is no hepatomegaly, splenomegaly or mass.     Tenderness:  There is no abdominal tenderness.  Musculoskeletal:        General: Normal range of motion.     Cervical back: Normal range of motion and neck supple.     Right lower leg: No edema.     Left lower leg: No edema.  Skin:    General: Skin is warm and dry.  Neurological:     General: No focal deficit present.     Mental Status: He is alert and oriented to person, place, and time.     Cranial Nerves: No cranial nerve deficit.     Motor: No weakness.  Psychiatric:        Mood and Affect: Mood normal.        Behavior: Behavior normal.      Results for orders placed or performed in visit on 02/07/23  POCT CBG (Fasting - Glucose)  Result Value Ref Range   Glucose Fasting, POC 97 70 - 99 mg/dL    Recent Results (from the past 2160 hour(Michela Herst))  POCT CBG (Fasting - Glucose)     Status: Abnormal   Collection Time: 12/09/22  2:28 PM  Result Value Ref Range   Glucose Fasting, POC 107 (A) 70 - 99 mg/dL  POCT CBG (Fasting - Glucose)     Status: None   Collection Time: 02/07/23  1:16 PM  Result Value Ref Range   Glucose Fasting, POC 97 70 - 99 mg/dL      Assessment & Plan:  As per problem list. Instructed to increase fluid intake to at least 1.5-2l /day  Problem List Items Addressed This Visit   None Visit Diagnoses     Diabetes mellitus without complication (HCC)    -  Primary   Relevant Orders   POCT CBG (Fasting - Glucose) (Completed)   Acute kidney injury (nontraumatic) (HCC)       Dehydration           Return in about 4 weeks (around 03/07/2023) for fu with labs prior.   Total time spent: 20 minutes  Luna Fuse, MD  02/07/2023   This document may have been prepared by [facility] Voice Recognition software and as such may include unintentional dictation errors.

## 2023-02-25 ENCOUNTER — Other Ambulatory Visit: Payer: Self-pay | Admitting: Internal Medicine

## 2023-02-25 DIAGNOSIS — I10 Essential (primary) hypertension: Secondary | ICD-10-CM

## 2023-03-09 ENCOUNTER — Other Ambulatory Visit: Payer: Medicare HMO

## 2023-03-09 DIAGNOSIS — N179 Acute kidney failure, unspecified: Secondary | ICD-10-CM | POA: Diagnosis not present

## 2023-03-10 LAB — BMP8+ANION GAP: Sodium: 140 mmol/L (ref 134–144)

## 2023-03-13 ENCOUNTER — Ambulatory Visit (INDEPENDENT_AMBULATORY_CARE_PROVIDER_SITE_OTHER): Payer: Medicare HMO | Admitting: Internal Medicine

## 2023-03-13 VITALS — BP 140/85 | HR 58 | Ht 70.0 in | Wt 179.2 lb

## 2023-03-13 DIAGNOSIS — L03114 Cellulitis of left upper limb: Secondary | ICD-10-CM

## 2023-03-13 DIAGNOSIS — S63602A Unspecified sprain of left thumb, initial encounter: Secondary | ICD-10-CM | POA: Diagnosis not present

## 2023-03-13 DIAGNOSIS — E119 Type 2 diabetes mellitus without complications: Secondary | ICD-10-CM | POA: Diagnosis not present

## 2023-03-13 DIAGNOSIS — S63619A Unspecified sprain of unspecified finger, initial encounter: Secondary | ICD-10-CM | POA: Diagnosis not present

## 2023-03-13 LAB — POCT CBG (FASTING - GLUCOSE)-MANUAL ENTRY: Glucose Fasting, POC: 103 mg/dL — AB (ref 70–99)

## 2023-03-13 MED ORDER — DICLOFENAC SODIUM 1 % EX GEL: 2.0000 g | Freq: Four times a day (QID) | CUTANEOUS | 0 refills | Status: AC

## 2023-03-13 MED ORDER — CEPHALEXIN 250 MG PO CAPS: 250.0000 mg | ORAL_CAPSULE | Freq: Four times a day (QID) | ORAL | 0 refills | Status: AC

## 2023-03-13 MED ORDER — PREDNISONE 20 MG PO TABS: 40.0000 mg | ORAL_TABLET | Freq: Every day | ORAL | 0 refills | Status: AC

## 2023-03-13 NOTE — Progress Notes (Signed)
Established Patient Office Visit  Subjective:  Patient ID: Paul Gaines, male    DOB: 08/05/1932  Age: 89 y.o. MRN: [id number]  Chief Complaint  Patient presents with   Follow-up    1 mo f/u    Complains of pain and swelling of his left thumb and wrist after striking his hand in a door jamb. Labs reviewed and notable for improvement in Cr and  GFR.     No other concerns at this time.   Past Medical History:  Diagnosis Date   Arthritis    Asthma    BCC (basal cell carcinoma) 09/15/2022   superficial at left upper back, [facility] 11/30/22   Cancer (HCC)    hx of skin cancer on nose   Chronic kidney disease    frequency   Coronary artery disease    stent - 2001    Dysplastic nevus 08/13/2014   left post distal deltoid   Hypertension    Legally blind    Myocardial infarction (HCC)    Pneumonia    hx of several times per pt    Shortness of breath    with exertion    Squamous cell carcinoma in situ (SCCIS) 05/24/2022   left temple, Mohs 07/20/2022   Squamous cell carcinoma of skin 07/01/2010   left infraorbital (in situ)    Past Surgical History:  Procedure Laterality Date   CORONARY ANGIOPLASTY     1988, stents    EYE SURGERY     right eye surgery , macular transrelocation    HERNIA REPAIR     KNEE ARTHROSCOPY     left    TONSILLECTOMY     TOTAL KNEE ARTHROPLASTY  03/26/2012   Procedure: TOTAL KNEE ARTHROPLASTY;  Surgeon: Loanne Drilling, MD;  Location: [facility];  Service: Orthopedics;  Laterality: Left;    Social History   Socioeconomic History   Marital status: Married    Spouse name: Not on file   Number of children: Not on file   Years of education: Not on file   Highest education level: Not on file  Occupational History   Not on file  Tobacco Use   Smoking status: Former    Current packs/day: 0.00    Types: Cigarettes    Quit date: 07/18/1976    Years since quitting: 46.6   Smokeless tobacco: Never  Substance and Sexual Activity   Alcohol use: Never     Comment: infrequent per pt    Drug use: No   Sexual activity: Not on file  Other Topics Concern   Not on file  Social History Narrative   Not on file   Social Determinants of Health   Financial Resource Strain: Not on file  Food Insecurity: Not on file  Transportation Needs: Not on file  Physical Activity: Not on file  Stress: Not on file  Social Connections: Not on file  Intimate Partner Violence: Not on file    No family history on file.  Allergies  Allergen Reactions   Celecoxib     Reaction=heart attack per patient   Lisinopril     Review of Systems  All other systems reviewed and are negative.      Objective:   BP (!) 140/85   Pulse (!) 58   Ht 5\' 10"  (1.778 m)   Wt 179 lb 3.2 oz (81.3 kg)   SpO2 97%   BMI 25.71 kg/m   Vitals:   03/13/23 1351  BP: (!) 140/85  Pulse: (!) 58  Height: 5\' 10"  (1.778 m)  Weight: 179 lb 3.2 oz (81.3 kg)  SpO2: 97%  BMI (Calculated): 25.71    Physical Exam Musculoskeletal:        General: Swelling present.     Left wrist: Swelling and tenderness present.     Comments: Erythema of right wrist extending to forearm       Results for orders placed or performed in visit on 03/13/23  POCT CBG (Fasting - Glucose)  Result Value Ref Range   Glucose Fasting, POC 103 (A) 70 - 99 mg/dL    Recent Results (from the past 2160 hour(Kimm Ungaro))  Comprehensive metabolic panel     Status: Abnormal   Collection Time: 02/02/23  3:38 PM  Result Value Ref Range   Glucose 93 70 - 99 mg/dL   BUN 28 (H) 8 - 27 mg/dL   Creatinine, Ser 0.98 (H) 0.76 - 1.27 mg/dL   eGFR 52 (L) >11 BJ/YNW/2.95   BUN/Creatinine Ratio 21 10 - 24   Sodium 143 134 - 144 mmol/L   Potassium 4.8 3.5 - 5.2 mmol/L   Chloride 107 (H) 96 - 106 mmol/L   CO2 23 20 - 29 mmol/L   Calcium 9.7 8.6 - 10.2 mg/dL   Total Protein 6.0 6.0 - 8.5 g/dL   Albumin 4.1 3.7 - 4.7 g/dL   Globulin, Total 1.9 1.5 - 4.5 g/dL   Bilirubin Total 0.4 0.0 - 1.2 mg/dL   Alkaline  Phosphatase 59 44 - 121 IU/L   AST 10 0 - 40 IU/L   ALT 11 0 - 44 IU/L  Lipid Panel w/o Chol/HDL Ratio     Status: None   Collection Time: 02/02/23  3:38 PM  Result Value Ref Range   Cholesterol, Total 113 100 - 199 mg/dL   Triglycerides 55 0 - 149 mg/dL   HDL 63 >62 mg/dL   VLDL Cholesterol Cal 13 5 - 40 mg/dL   LDL Chol Calc (NIH) 37 0 - 99 mg/dL  POCT CBG (Fasting - Glucose)     Status: None   Collection Time: 02/07/23  1:16 PM  Result Value Ref Range   Glucose Fasting, POC 97 70 - 99 mg/dL  ZHY8+MVHQI Gap     Status: Abnormal   Collection Time: 03/09/23  9:07 AM  Result Value Ref Range   Glucose 95 70 - 99 mg/dL   BUN 22 8 - 27 mg/dL   Creatinine, Ser 6.96 0.76 - 1.27 mg/dL   eGFR 58 (L) >29 BM/WUX/3.24   BUN/Creatinine Ratio 18 10 - 24   Sodium 140 134 - 144 mmol/L   Potassium 4.3 3.5 - 5.2 mmol/L   Chloride 106 96 - 106 mmol/L   CO2 20 20 - 29 mmol/L   Anion Gap 14.0 10.0 - 18.0 mmol/L   Calcium 9.4 8.6 - 10.2 mg/dL  POCT CBG (Fasting - Glucose)     Status: Abnormal   Collection Time: 03/13/23  1:54 PM  Result Value Ref Range   Glucose Fasting, POC 103 (A) 70 - 99 mg/dL      Assessment & Plan:  As per problem list.  Problem List Items Addressed This Visit   None Visit Diagnoses     Diabetes mellitus without complication (HCC)    -  Primary   Relevant Orders   POCT CBG (Fasting - Glucose) (Completed)   Comprehensive metabolic panel   Lipid panel   Hemoglobin A1c   Sprain of multiple digits of  left hand including sprain of thumb, initial encounter       Relevant Medications   diclofenac Sodium (VOLTAREN) 1 % GEL   predniSONE (DELTASONE) 20 MG tablet   Other Relevant Orders   DG Hand Complete Left   Cellulitis of left upper extremity       Relevant Medications   cephALEXin (KEFLEX) 250 MG capsule       Return in about 10 weeks (around 05/22/2023), or if symptoms worsen or fail to improve, for fu with labs prior.   Total time spent: 30 minutes  Luna Fuse, MD  03/13/2023   This document may have been prepared by [facility] Voice Recognition software and as such may include unintentional dictation errors.

## 2023-03-14 ENCOUNTER — Ambulatory Visit (INDEPENDENT_AMBULATORY_CARE_PROVIDER_SITE_OTHER): Payer: Medicare HMO

## 2023-03-14 DIAGNOSIS — M79642 Pain in left hand: Secondary | ICD-10-CM

## 2023-03-14 DIAGNOSIS — S63602A Unspecified sprain of left thumb, initial encounter: Secondary | ICD-10-CM

## 2023-03-15 NOTE — Progress Notes (Signed)
Spoke with pt who verbalized understanding.

## 2023-03-16 ENCOUNTER — Encounter: Payer: Medicare HMO | Admitting: Dermatology

## 2023-03-28 ENCOUNTER — Encounter: Payer: Self-pay | Admitting: Cardiovascular Disease

## 2023-03-28 ENCOUNTER — Ambulatory Visit: Payer: Medicare HMO | Admitting: Cardiovascular Disease

## 2023-03-28 VITALS — BP 142/80 | HR 50 | Ht 70.0 in | Wt 176.0 lb

## 2023-03-28 DIAGNOSIS — I2510 Atherosclerotic heart disease of native coronary artery without angina pectoris: Secondary | ICD-10-CM | POA: Diagnosis not present

## 2023-03-28 DIAGNOSIS — E119 Type 2 diabetes mellitus without complications: Secondary | ICD-10-CM | POA: Diagnosis not present

## 2023-03-28 DIAGNOSIS — I10 Essential (primary) hypertension: Secondary | ICD-10-CM

## 2023-03-28 DIAGNOSIS — R0602 Shortness of breath: Secondary | ICD-10-CM | POA: Diagnosis not present

## 2023-03-28 MED ORDER — LOSARTAN POTASSIUM 50 MG PO TABS: 50.0000 mg | ORAL_TABLET | Freq: Every day | ORAL | 11 refills | Status: DC

## 2023-03-28 NOTE — Progress Notes (Signed)
Cardiology Office Note   Date:  03/28/2023   ID:  Paul Gaines, Paul Gaines August 11, 1932, MRN [id number]  PCP:  Sherron Monday, MD  Cardiologist:  Adrian Blackwater, MD      History of Present Illness: Paul Gaines is a 89 y.o. male who presents for No chief complaint on file.   HPI    Past Medical History:  Diagnosis Date   Arthritis    Asthma    BCC (basal cell carcinoma) 09/15/2022   superficial at left upper back, [facility] 11/30/22   Cancer (HCC)    hx of skin cancer on nose   Chronic kidney disease    frequency   Coronary artery disease    stent - 2001    Dysplastic nevus 08/13/2014   left post distal deltoid   Hypertension    Legally blind    Myocardial infarction (HCC)    Pneumonia    hx of several times per pt    Shortness of breath    with exertion    Squamous cell carcinoma in situ (SCCIS) 05/24/2022   left temple, Mohs 07/20/2022   Squamous cell carcinoma of skin 07/01/2010   left infraorbital (in situ)     Past Surgical History:  Procedure Laterality Date   CORONARY ANGIOPLASTY     1988, stents    EYE SURGERY     right eye surgery , macular transrelocation    HERNIA REPAIR     KNEE ARTHROSCOPY     left    TONSILLECTOMY     TOTAL KNEE ARTHROPLASTY  03/26/2012   Procedure: TOTAL KNEE ARTHROPLASTY;  Surgeon: Loanne Drilling, MD;  Location: [facility];  Service: Orthopedics;  Laterality: Left;     Current Outpatient Medications  Medication Sig Dispense Refill   dorzolamide-timolol (COSOPT) 2-0.5 % ophthalmic solution Place 1 drop into both eyes 2 (two) times daily.     losartan (COZAAR) 50 MG tablet Take 1 tablet (50 mg total) by mouth daily. 30 tablet 11   Accu-Chek Softclix Lancets lancets TEST TWO TIMES DAILY 200 each 3   amLODipine (NORVASC) 5 MG tablet TAKE 2 TABLETS ONE TIME DAILY 180 tablet 3   aspirin EC 81 MG tablet Take 81 mg by mouth daily.     carvedilol (COREG) 12.5 MG tablet TAKE 1 TABLET TWICE DAILY 180 tablet 3   fluticasone (FLONASE) 50  MCG/ACT nasal spray USE 1 SPRAY IN EACH NOSTRIL IN THE MORNING AS DIRECTED 32 g 3   iron polysaccharides (NIFEREX) 150 MG capsule Take 1 capsule (150 mg total) by mouth 2 (two) times daily. 42 capsule 0   pravastatin (PRAVACHOL) 40 MG tablet TAKE 1 TABLET EVERY DAY 90 tablet 3   prednisoLONE acetate (PRED FORTE) 1 % ophthalmic suspension Place 1 drop into the right eye every morning.     Psyllium (METAMUCIL PO) Take 1 tablet by mouth every morning.     tamsulosin (FLOMAX) 0.4 MG CAPS capsule Take 1 capsule (0.4 mg total) by mouth every morning. 90 capsule 3   timolol (BETIMOL) 0.5 % ophthalmic solution Place 1 drop into both eyes every morning.     trospium (SANCTURA) 20 MG tablet Take 20 mg by mouth at bedtime.     No current facility-administered medications for this visit.    Allergies:   Celecoxib and Lisinopril    Social History:   reports that he quit smoking about 46 years ago. His smoking use included cigarettes. He has never used smokeless  tobacco. He reports that he does not drink alcohol and does not use drugs.   Family History:  family history is not on file.    ROS:     ROS    All other systems are reviewed and negative.    PHYSICAL EXAM: VS:  BP (!) 142/80   Pulse (!) 50   Ht 5\' 10"  (1.778 m)   Wt 176 lb (79.8 kg)   SpO2 98%   BMI 25.25 kg/m  , BMI Body mass index is 25.25 kg/m. Last weight:  Wt Readings from Last 3 Encounters:  03/28/23 176 lb (79.8 kg)  03/13/23 179 lb 3.2 oz (81.3 kg)  02/07/23 178 lb 12.8 oz (81.1 kg)     Physical Exam    EKG:   Recent Labs: 02/02/2023: ALT 11 03/09/2023: BUN 22; Creatinine, Ser 1.20; Potassium 4.3; Sodium 140    Lipid Panel    Component Value Date/Time   CHOL 113 02/02/2023 1538   TRIG 55 02/02/2023 1538   HDL 63 02/02/2023 1538   LDLCALC 37 02/02/2023 1538      Other studies Reviewed: Additional studies/ records that were reviewed today include:  Review of the above records demonstrates:       No  data to display            ASSESSMENT AND PLAN:    ICD-10-CM   1. Primary hypertension  I10 losartan (COZAAR) 50 MG tablet   Increase losartan to 50 mg aily as BP high.    2. Coronary artery disease involving native coronary artery of native heart without angina pectoris  I25.10 losartan (COZAAR) 50 MG tablet    3. SOB (shortness of breath)  R06.02 losartan (COZAAR) 50 MG tablet    4. Diabetes mellitus without complication (HCC)  E11.9 losartan (COZAAR) 50 MG tablet       Problem List Items Addressed This Visit   None Visit Diagnoses     Primary hypertension    -  Primary   Increase losartan to 50 mg aily as BP high.   Relevant Medications   losartan (COZAAR) 50 MG tablet   Coronary artery disease involving native coronary artery of native heart without angina pectoris       Relevant Medications   losartan (COZAAR) 50 MG tablet   SOB (shortness of breath)       Relevant Medications   losartan (COZAAR) 50 MG tablet   Diabetes mellitus without complication (HCC)       Relevant Medications   losartan (COZAAR) 50 MG tablet          Disposition:   Return in about 2 months (around 05/28/2023).    Total time spent: 35 minutes  Signed,  Adrian Blackwater, MD  03/28/2023 1:21 PM    [facility]

## 2023-05-22 ENCOUNTER — Ambulatory Visit (INDEPENDENT_AMBULATORY_CARE_PROVIDER_SITE_OTHER): Payer: Medicare HMO | Admitting: Internal Medicine

## 2023-05-22 ENCOUNTER — Encounter: Payer: Self-pay | Admitting: Internal Medicine

## 2023-05-22 ENCOUNTER — Other Ambulatory Visit: Payer: Medicare HMO

## 2023-05-22 VITALS — BP 130/87 | HR 57 | Ht 70.0 in | Wt 179.6 lb

## 2023-05-22 DIAGNOSIS — A630 Anogenital (venereal) warts: Secondary | ICD-10-CM | POA: Diagnosis not present

## 2023-05-22 DIAGNOSIS — E119 Type 2 diabetes mellitus without complications: Secondary | ICD-10-CM

## 2023-05-22 DIAGNOSIS — Z23 Encounter for immunization: Secondary | ICD-10-CM

## 2023-05-22 DIAGNOSIS — Z0130 Encounter for examination of blood pressure without abnormal findings: Secondary | ICD-10-CM

## 2023-05-22 LAB — POCT CBG (FASTING - GLUCOSE)-MANUAL ENTRY: Glucose Fasting, POC: 116 mg/dL — AB (ref 70–99)

## 2023-05-22 MED ORDER — PODOFILOX 0.5 % EX GEL: Freq: Two times a day (BID) | CUTANEOUS | 0 refills | Status: DC

## 2023-05-22 NOTE — Progress Notes (Signed)
Established Patient Office Visit  Subjective:  Patient ID: Paul Gaines, male    DOB: May 24, 1933  Age: 89 y.o. MRN: [id number]  No chief complaint on file.   No new complaints, here for lab review and medication refills. Failed to have previsit labs done. Reports lesion on his right inner thigh.   No other concerns at this time.   Past Medical History:  Diagnosis Date   Arthritis    Asthma    BCC (basal cell carcinoma) 09/15/2022   superficial at left upper back, EDC 11/30/22   Cancer (HCC)    hx of skin cancer on nose   Chronic kidney disease    frequency   Coronary artery disease    stent - 2001    Dysplastic nevus 08/13/2014   left post distal deltoid   Hypertension    Legally blind    Myocardial infarction (HCC)    Pneumonia    hx of several times per pt    Shortness of breath    with exertion    Squamous cell carcinoma in situ (SCCIS) 05/24/2022   left temple, Mohs 07/20/2022   Squamous cell carcinoma of skin 07/01/2010   left infraorbital (in situ)    Past Surgical History:  Procedure Laterality Date   CORONARY ANGIOPLASTY     1988, stents    EYE SURGERY     right eye surgery , macular transrelocation    HERNIA REPAIR     KNEE ARTHROSCOPY     left    TONSILLECTOMY     TOTAL KNEE ARTHROPLASTY  03/26/2012   Procedure: TOTAL KNEE ARTHROPLASTY;  Surgeon: Loanne Drilling, MD;  Location: [facility];  Service: Orthopedics;  Laterality: Left;    Social History   Socioeconomic History   Marital status: Married    Spouse name: Not on file   Number of children: Not on file   Years of education: Not on file   Highest education level: Not on file  Occupational History   Not on file  Tobacco Use   Smoking status: Former    Current packs/day: 0.00    Types: Cigarettes    Quit date: 07/18/1976    Years since quitting: 46.8   Smokeless tobacco: Never  Substance and Sexual Activity   Alcohol use: Never    Comment: infrequent per pt    Drug use: No   Sexual  activity: Not on file  Other Topics Concern   Not on file  Social History Narrative   Not on file   Social Determinants of Health   Financial Resource Strain: Not on file  Food Insecurity: Not on file  Transportation Needs: Not on file  Physical Activity: Not on file  Stress: Not on file  Social Connections: Not on file  Intimate Partner Violence: Not on file    History reviewed. No pertinent family history.  Allergies  Allergen Reactions   Celecoxib     Reaction=heart attack per patient   Lisinopril     Review of Systems  Constitutional:  Negative for malaise/fatigue.  HENT: Negative.    Eyes: Negative.   Respiratory:  Negative for cough.   Cardiovascular:  Negative for chest pain and palpitations.  Gastrointestinal: Negative.   Neurological:  Negative for dizziness.  All other systems reviewed and are negative.      Objective:   BP 130/87   Pulse (!) 57   Ht 5\' 10"  (1.778 m)   Wt 179 lb 9.6 oz (81.5 kg)  SpO2 99%   BMI 25.77 kg/m   Vitals:   05/22/23 1403  BP: 130/87  Pulse: (!) 57  Height: 5\' 10"  (1.778 m)  Weight: 179 lb 9.6 oz (81.5 kg)  SpO2: 99%  BMI (Calculated): 25.77    Physical Exam Vitals reviewed.  Constitutional:      Appearance: Normal appearance.  HENT:     Head: Normocephalic.     Left Ear: There is no impacted cerumen.     Nose: Nose normal.     Mouth/Throat:     Mouth: Mucous membranes are moist.     Pharynx: No posterior oropharyngeal erythema.  Eyes:     Comments: Poor visual acuity bilaterally  Cardiovascular:     Rate and Rhythm: Regular rhythm.     Chest Wall: PMI is not displaced.     Pulses: Normal pulses.     Heart sounds: Normal heart sounds. No murmur heard. Pulmonary:     Effort: Pulmonary effort is normal.     Breath sounds: Normal air entry. No rhonchi or rales.  Abdominal:     General: Abdomen is flat. Bowel sounds are normal. There is no distension.     Palpations: Abdomen is soft. There is no  hepatomegaly, splenomegaly or mass.     Tenderness: There is no abdominal tenderness.  Musculoskeletal:        General: Normal range of motion.     Cervical back: Normal range of motion and neck supple.     Right lower leg: No edema.     Left lower leg: No edema.  Skin:    General: Skin is warm and dry.     Comments: Wart like lesion on right groin.  Neurological:     General: No focal deficit present.     Mental Status: He is alert and oriented to person, place, and time.     Cranial Nerves: No cranial nerve deficit.     Motor: No weakness.  Psychiatric:        Mood and Affect: Mood normal.        Behavior: Behavior normal.      Results for orders placed or performed in visit on 05/22/23  POCT CBG (Fasting - Glucose)  Result Value Ref Range   Glucose Fasting, POC 116 (A) 70 - 99 mg/dL    Recent Results (from the past 2160 hour(Kuuipo Anzaldo))  BMP8+Anion Gap     Status: Abnormal   Collection Time: 03/09/23  9:07 AM  Result Value Ref Range   Glucose 95 70 - 99 mg/dL   BUN 22 8 - 27 mg/dL   Creatinine, Ser 1.61 0.76 - 1.27 mg/dL   eGFR 58 (L) >09 UE/AVW/0.98   BUN/Creatinine Ratio 18 10 - 24   Sodium 140 134 - 144 mmol/L   Potassium 4.3 3.5 - 5.2 mmol/L   Chloride 106 96 - 106 mmol/L   CO2 20 20 - 29 mmol/L   Anion Gap 14.0 10.0 - 18.0 mmol/L   Calcium 9.4 8.6 - 10.2 mg/dL  POCT CBG (Fasting - Glucose)     Status: Abnormal   Collection Time: 03/13/23  1:54 PM  Result Value Ref Range   Glucose Fasting, POC 103 (A) 70 - 99 mg/dL  POCT CBG (Fasting - Glucose)     Status: Abnormal   Collection Time: 05/22/23  2:06 PM  Result Value Ref Range   Glucose Fasting, POC 116 (A) 70 - 99 mg/dL      Assessment & Plan:  As per problem list  Problem List Items Addressed This Visit   None Visit Diagnoses     Diabetes mellitus without complication (HCC)    -  Primary   Relevant Orders   POCT CBG (Fasting - Glucose) (Completed)   Genital warts       Relevant Medications   podofilox  (CONDYLOX) 0.5 % gel       Return in about 3 months (around 08/22/2023) for awv with labs prior.   Total time spent: 20 minutes  Luna Fuse, MD  05/22/2023   This document may have been prepared by [facility] Voice Recognition software and as such may include unintentional dictation errors.

## 2023-05-23 LAB — COMPREHENSIVE METABOLIC PANEL
ALT: 10 [IU]/L (ref 0–44)
AST: 8 [IU]/L (ref 0–40)
Albumin: 3.8 g/dL (ref 3.7–4.7)
Alkaline Phosphatase: 56 [IU]/L (ref 44–121)
BUN/Creatinine Ratio: 23 (ref 10–24)
BUN: 29 mg/dL — ABNORMAL HIGH (ref 8–27)
Bilirubin Total: 0.4 mg/dL (ref 0.0–1.2)
CO2: 22 mmol/L (ref 20–29)
Calcium: 9.4 mg/dL (ref 8.6–10.2)
Chloride: 109 mmol/L — ABNORMAL HIGH (ref 96–106)
Creatinine, Ser: 1.26 mg/dL (ref 0.76–1.27)
Globulin, Total: 2.3 g/dL (ref 1.5–4.5)
Glucose: 99 mg/dL (ref 70–99)
Potassium: 4.8 mmol/L (ref 3.5–5.2)
Sodium: 143 mmol/L (ref 134–144)
Total Protein: 6.1 g/dL (ref 6.0–8.5)
eGFR: 55 mL/min/{1.73_m2} — ABNORMAL LOW (ref >59)

## 2023-05-23 LAB — LIPID PANEL
Chol/HDL Ratio: 1.9 ratio (ref 0.0–5.0)
Cholesterol, Total: 126 mg/dL (ref 100–199)
HDL: 66 mg/dL (ref >39)
LDL Chol Calc (NIH): 46 mg/dL (ref 0–99)
Triglycerides: 66 mg/dL (ref 0–149)
VLDL Cholesterol Cal: 14 mg/dL (ref 5–40)

## 2023-05-23 LAB — HEMOGLOBIN A1C
Est. average glucose Bld gHb Est-mCnc: 120 mg/dL
Hgb A1c MFr Bld: 5.8 % — ABNORMAL HIGH (ref 4.8–5.6)

## 2023-05-24 ENCOUNTER — Telehealth: Payer: Self-pay | Admitting: Internal Medicine

## 2023-05-24 NOTE — Telephone Encounter (Signed)
Patient left VM that the med TJ sent in was going to be $700 and he cannot afford that. Did you get a PA for it? Its the podofilox (CONDYLOX) 0.5 % gel

## 2023-05-29 ENCOUNTER — Ambulatory Visit: Payer: Medicare HMO | Admitting: Cardiovascular Disease

## 2023-05-29 ENCOUNTER — Encounter: Payer: Self-pay | Admitting: Cardiovascular Disease

## 2023-05-29 VITALS — BP 162/76 | HR 50 | Ht 70.0 in | Wt 180.8 lb

## 2023-05-29 DIAGNOSIS — E119 Type 2 diabetes mellitus without complications: Secondary | ICD-10-CM | POA: Diagnosis not present

## 2023-05-29 DIAGNOSIS — I2510 Atherosclerotic heart disease of native coronary artery without angina pectoris: Secondary | ICD-10-CM | POA: Diagnosis not present

## 2023-05-29 DIAGNOSIS — I10 Essential (primary) hypertension: Secondary | ICD-10-CM

## 2023-05-29 DIAGNOSIS — R0602 Shortness of breath: Secondary | ICD-10-CM

## 2023-05-29 MED ORDER — LOSARTAN POTASSIUM 100 MG PO TABS: 100.0000 mg | ORAL_TABLET | Freq: Every day | ORAL | 11 refills | Status: DC

## 2023-05-29 NOTE — Progress Notes (Signed)
Cardiology Office Note   Date:  05/29/2023   ID:  Paul Gaines, DOB 01-28-1933, MRN [id number]  PCP:  Sherron Monday, MD  Cardiologist:  Adrian Blackwater, MD      History of Present Illness: Paul Gaines is a 89 y.o. male who presents for  Chief Complaint  Patient presents with   Follow-up    2 month follow up    BP up, but feeling fine, no Ha      Past Medical History:  Diagnosis Date   Arthritis    Asthma    BCC (basal cell carcinoma) 09/15/2022   superficial at left upper back, [facility] 11/30/22   Cancer (HCC)    hx of skin cancer on nose   Chronic kidney disease    frequency   Coronary artery disease    stent - 2001    Dysplastic nevus 08/13/2014   left post distal deltoid   Hypertension    Legally blind    Myocardial infarction (HCC)    Pneumonia    hx of several times per pt    Shortness of breath    with exertion    Squamous cell carcinoma in situ (SCCIS) 05/24/2022   left temple, Mohs 07/20/2022   Squamous cell carcinoma of skin 07/01/2010   left infraorbital (in situ)     Past Surgical History:  Procedure Laterality Date   CORONARY ANGIOPLASTY     1988, stents    EYE SURGERY     right eye surgery , macular transrelocation    HERNIA REPAIR     KNEE ARTHROSCOPY     left    TONSILLECTOMY     TOTAL KNEE ARTHROPLASTY  03/26/2012   Procedure: TOTAL KNEE ARTHROPLASTY;  Surgeon: Loanne Drilling, MD;  Location: [facility];  Service: Orthopedics;  Laterality: Left;     Current Outpatient Medications  Medication Sig Dispense Refill   Accu-Chek Softclix Lancets lancets TEST TWO TIMES DAILY 200 each 3   amLODipine (NORVASC) 5 MG tablet TAKE 2 TABLETS ONE TIME DAILY 180 tablet 3   aspirin EC 81 MG tablet Take 81 mg by mouth daily.     carvedilol (COREG) 12.5 MG tablet TAKE 1 TABLET TWICE DAILY 180 tablet 3   dorzolamide-timolol (COSOPT) 2-0.5 % ophthalmic solution Place 1 drop into both eyes 2 (two) times daily.     fluticasone (FLONASE) 50 MCG/ACT  nasal spray USE 1 SPRAY IN EACH NOSTRIL IN THE MORNING AS DIRECTED 32 g 3   losartan (COZAAR) 100 MG tablet Take 1 tablet (100 mg total) by mouth daily. 30 tablet 11   pravastatin (PRAVACHOL) 40 MG tablet TAKE 1 TABLET EVERY DAY 90 tablet 3   prednisoLONE acetate (PRED FORTE) 1 % ophthalmic suspension Place 1 drop into the right eye every morning.     Psyllium (METAMUCIL PO) Take 1 tablet by mouth every morning.     tamsulosin (FLOMAX) 0.4 MG CAPS capsule Take 1 capsule (0.4 mg total) by mouth every morning. 90 capsule 3   timolol (BETIMOL) 0.5 % ophthalmic solution Place 1 drop into both eyes every morning.     trospium (SANCTURA) 20 MG tablet Take 20 mg by mouth at bedtime.     iron polysaccharides (NIFEREX) 150 MG capsule Take 1 capsule (150 mg total) by mouth 2 (two) times daily. 42 capsule 0   No current facility-administered medications for this visit.    Allergies:   Celecoxib and Lisinopril    Social History:  reports that he quit smoking about 46 years ago. His smoking use included cigarettes. He has never used smokeless tobacco. He reports that he does not drink alcohol and does not use drugs.   Family History:  family history is not on file.    ROS:     Review of Systems  Constitutional: Negative.   HENT: Negative.    Eyes: Negative.   Respiratory: Negative.    Gastrointestinal: Negative.   Genitourinary: Negative.   Musculoskeletal: Negative.   Skin: Negative.   Neurological: Negative.   Endo/Heme/Allergies: Negative.   Psychiatric/Behavioral: Negative.    All other systems reviewed and are negative.     All other systems are reviewed and negative.    PHYSICAL EXAM: VS:  BP (!) 162/76   Pulse (!) 50   Ht 5\' 10"  (1.778 m)   Wt 180 lb 12.8 oz (82 kg)   SpO2 96%   BMI 25.94 kg/m  , BMI Body mass index is 25.94 kg/m. Last weight:  Wt Readings from Last 3 Encounters:  05/29/23 180 lb 12.8 oz (82 kg)  05/22/23 179 lb 9.6 oz (81.5 kg)  03/28/23 176 lb (79.8  kg)     Physical Exam Vitals reviewed.  Constitutional:      Appearance: Normal appearance. He is normal weight.  HENT:     Head: Normocephalic.     Nose: Nose normal.     Mouth/Throat:     Mouth: Mucous membranes are moist.  Eyes:     Pupils: Pupils are equal, round, and reactive to light.  Cardiovascular:     Rate and Rhythm: Normal rate and regular rhythm.     Pulses: Normal pulses.     Heart sounds: Normal heart sounds.  Pulmonary:     Effort: Pulmonary effort is normal.  Abdominal:     General: Abdomen is flat. Bowel sounds are normal.  Musculoskeletal:        General: Normal range of motion.     Cervical back: Normal range of motion.  Skin:    General: Skin is warm.  Neurological:     General: No focal deficit present.     Mental Status: He is alert.  Psychiatric:        Mood and Affect: Mood normal.       EKG:   Recent Labs: 05/22/2023: ALT 10; BUN 29; Creatinine, Ser 1.26; Potassium 4.8; Sodium 143    Lipid Panel    Component Value Date/Time   CHOL 126 05/22/2023 0840   TRIG 66 05/22/2023 0840   HDL 66 05/22/2023 0840   CHOLHDL 1.9 05/22/2023 0840   LDLCALC 46 05/22/2023 0840      Other studies Reviewed: Additional studies/ records that were reviewed today include:  Review of the above records demonstrates:       No data to display            ASSESSMENT AND PLAN:    ICD-10-CM   1. Diabetes mellitus without complication (HCC)  E11.9 losartan (COZAAR) 100 MG tablet    2. Primary hypertension  I10 losartan (COZAAR) 100 MG tablet   Repeat BP 150/80, change losartan 100 mg daily    3. Coronary artery disease involving native coronary artery of native heart without angina pectoris  I25.10 losartan (COZAAR) 100 MG tablet    4. SOB (shortness of breath)  R06.02 losartan (COZAAR) 100 MG tablet       Problem List Items Addressed This Visit   None Visit Diagnoses  Diabetes mellitus without complication (HCC)    -  Primary   Relevant  Medications   losartan (COZAAR) 100 MG tablet   Primary hypertension       Repeat BP 150/80, change losartan 100 mg daily   Relevant Medications   losartan (COZAAR) 100 MG tablet   Coronary artery disease involving native coronary artery of native heart without angina pectoris       Relevant Medications   losartan (COZAAR) 100 MG tablet   SOB (shortness of breath)       Relevant Medications   losartan (COZAAR) 100 MG tablet          Disposition:   Return in about 4 weeks (around 06/26/2023).    Total time spent: 30 minutes  Signed,  Adrian Blackwater, MD  05/29/2023 1:45 PM    [facility]

## 2023-06-01 ENCOUNTER — Encounter: Payer: Self-pay | Admitting: Dermatology

## 2023-06-01 ENCOUNTER — Ambulatory Visit: Payer: Medicare HMO | Admitting: Dermatology

## 2023-06-01 DIAGNOSIS — L578 Other skin changes due to chronic exposure to nonionizing radiation: Secondary | ICD-10-CM | POA: Diagnosis not present

## 2023-06-01 DIAGNOSIS — B078 Other viral warts: Secondary | ICD-10-CM | POA: Diagnosis not present

## 2023-06-01 DIAGNOSIS — L570 Actinic keratosis: Secondary | ICD-10-CM | POA: Diagnosis not present

## 2023-06-01 DIAGNOSIS — D1801 Hemangioma of skin and subcutaneous tissue: Secondary | ICD-10-CM

## 2023-06-01 DIAGNOSIS — D492 Neoplasm of unspecified behavior of bone, soft tissue, and skin: Secondary | ICD-10-CM | POA: Diagnosis not present

## 2023-06-01 DIAGNOSIS — L820 Inflamed seborrheic keratosis: Secondary | ICD-10-CM | POA: Diagnosis not present

## 2023-06-01 DIAGNOSIS — L821 Other seborrheic keratosis: Secondary | ICD-10-CM

## 2023-06-01 DIAGNOSIS — Z1283 Encounter for screening for malignant neoplasm of skin: Secondary | ICD-10-CM

## 2023-06-01 DIAGNOSIS — W908XXA Exposure to other nonionizing radiation, initial encounter: Secondary | ICD-10-CM

## 2023-06-01 DIAGNOSIS — Z85828 Personal history of other malignant neoplasm of skin: Secondary | ICD-10-CM

## 2023-06-01 DIAGNOSIS — Z86018 Personal history of other benign neoplasm: Secondary | ICD-10-CM

## 2023-06-01 DIAGNOSIS — D692 Other nonthrombocytopenic purpura: Secondary | ICD-10-CM

## 2023-06-01 DIAGNOSIS — L814 Other melanin hyperpigmentation: Secondary | ICD-10-CM | POA: Diagnosis not present

## 2023-06-01 NOTE — Progress Notes (Signed)
Follow-Up Visit   Subjective  Paul Gaines is a 89 y.o. male who presents for the following: Skin Cancer Screening and Full Body Skin Exam. Hx of BCC, SCC, dysplastic nevi.   New spot in groin area. Dur: ~4 weeks. Burns some.   The patient presents for Total-Body Skin Exam (TBSE) for skin cancer screening and mole check. The patient has spots, moles and lesions to be evaluated, some may be new or changing and the patient may have concern these could be cancer.    The following portions of the chart were reviewed this encounter and updated as appropriate: medications, allergies, medical history  Review of Systems:  No other skin or systemic complaints except as noted in HPI or Assessment and Plan.  Objective  Well appearing patient in no apparent distress; mood and affect are within normal limits.  A full examination was performed including scalp, head, eyes, ears, nose, lips, neck, chest, axillae, abdomen, back, buttocks, bilateral upper extremities, bilateral lower extremities, hands, feet, fingers, toes, fingernails, and toenails. All findings within normal limits unless otherwise noted below.   Relevant physical exam findings are noted in the Assessment and Plan.  right mons pubis 31 x 25 hyperplastic keratotic plaque       L zygoma x1, L cheek x1, R helix x1, R forearm x2, L forearm x2 (7) Erythematous thin papules/macules with gritty scale.   right mons pubis 31 x 25 hyperplastic keratotic plaque    Assessment & Plan    HISTORY OF BASAL CELL CARCINOMA OF THE SKIN. Superficial at left upper back, [facility] 11/30/22  - No evidence of recurrence today - Recommend regular full body skin exams - Recommend daily broad spectrum sunscreen SPF 30+ to sun-exposed areas, reapply every 2 hours as needed.  - Call if any new or changing lesions are noted between office visits  HISTORY OF DYSPLASTIC NEVUS. Left post distal deltoid  No evidence of recurrence today Recommend  regular full body skin exams Recommend daily broad spectrum sunscreen SPF 30+ to sun-exposed areas, reapply every 2 hours as needed.  Call if any new or changing lesions are noted between office visits   HISTORY OF SQUAMOUS CELL CARCINOMA OF THE SKIN - No evidence of recurrence today - No lymphadenopathy - Recommend regular full body skin exams - Recommend daily broad spectrum sunscreen SPF 30+ to sun-exposed areas, reapply every 2 hours as needed.  - Call if any new or changing lesions are noted between office visits     SKIN CANCER SCREENING PERFORMED TODAY.  ACTINIC DAMAGE - Chronic condition, secondary to cumulative UV/sun exposure - diffuse scaly erythematous macules with underlying dyspigmentation - Recommend daily broad spectrum sunscreen SPF 30+ to sun-exposed areas, reapply every 2 hours as needed.  - Staying in the shade or wearing long sleeves, sun glasses (UVA+UVB protection) and wide brim hats (4-inch brim around the entire circumference of the hat) are also recommended for sun protection.  - Call for new or changing lesions.  LENTIGINES, SEBORRHEIC KERATOSES, HEMANGIOMAS - Benign normal skin lesions - Benign-appearing - Call for any changes  MELANOCYTIC NEVI - Tan-brown and/or pink-flesh-colored symmetric macules and papules - Benign appearing on exam today - Observation - Call clinic for new or changing moles - Recommend daily use of broad spectrum spf 30+ sunscreen to sun-exposed areas.    Purpura - Chronic; persistent and recurrent.  Treatable, but not curable. - Violaceous macules and patches at arms and hands. - Benign - Related to trauma, age,  sun damage and/or use of blood thinners, chronic use of topical and/or oral steroids - Observe - Can use OTC arnica containing moisturizer such as Dermend Bruise Formula if desired - Call for worsening or other concerns    Neoplasm of skin right mons pubis  Skin / nail biopsy Type of biopsy: tangential    Informed consent: discussed and consent obtained   Timeout: patient name, date of birth, surgical site, and procedure verified   Procedure prep:  Patient was prepped and draped in usual sterile fashion Prep type:  Isopropyl alcohol Anesthesia: the lesion was anesthetized in a standard fashion   Anesthetic:  1% lidocaine w/ epinephrine 1-100,000 buffered w/ 8.4% NaHCO3 Instrument used: DermaBlade   Hemostasis achieved with: pressure and aluminum chloride   Outcome: patient tolerated procedure well   Post-procedure details: sterile dressing applied and wound care instructions given   Dressing type: petrolatum    Specimen 1 - Surgical pathology Differential Diagnosis: SK vs condyloma vs SCC  Check Margins: No  Multiple specimens in bottle  AK (actinic keratosis) (7) L zygoma x1, L cheek x1, R helix x1, R forearm x2, L forearm x2  Actinic keratoses are precancerous spots that appear secondary to cumulative UV radiation exposure/sun exposure over time. They are chronic with expected duration over 1 year. A portion of actinic keratoses will progress to squamous cell carcinoma of the skin. It is not possible to reliably predict which spots will progress to skin cancer and so treatment is recommended to prevent development of skin cancer.  Recommend daily broad spectrum sunscreen SPF 30+ to sun-exposed areas, reapply every 2 hours as needed.  Recommend staying in the shade or wearing long sleeves, sun glasses (UVA+UVB protection) and wide brim hats (4-inch brim around the entire circumference of the hat). Call for new or changing lesions.  Destruction of lesion - L zygoma x1, L cheek x1, R helix x1, R forearm x2, L forearm x2 (7) Complexity: simple   Destruction method: cryotherapy   Informed consent: discussed and consent obtained   Timeout:  patient name, date of birth, surgical site, and procedure verified Lesion destroyed using liquid nitrogen: Yes   Region frozen until ice ball  extended beyond lesion: Yes   Cryo cycles: 1 or 2. Outcome: patient tolerated procedure well with no complications   Post-procedure details: wound care instructions given   Additional details:  Prior to procedure, discussed risks of blister formation, small wound, skin dyspigmentation, or rare scar following cryotherapy. Recommend Vaseline ointment to treated areas while healing.   Other viral warts right mons pubis  Vs ISK   Viral Wart (HPV) Counseling  Discussed viral / HPV (Human Papilloma Virus) etiology and risk of spread /infectivity to other areas of body as well as to other people.  Multiple treatments and methods may be required to clear warts and it is possible treatment may not be successful.  Treatment risks include discoloration; scarring and there is still potential for wart recurrence.  Destruction of lesion - right mons pubis Complexity: simple   Destruction method: cryotherapy   Informed consent: discussed and consent obtained   Timeout:  patient name, date of birth, surgical site, and procedure verified Lesion destroyed using liquid nitrogen: Yes   Region frozen until ice ball extended beyond lesion: Yes   Cryo cycles: 1 or 2. Outcome: patient tolerated procedure well with no complications   Post-procedure details: wound care instructions given   Additional details:  Prior to procedure, discussed risks of blister formation,  small wound, skin dyspigmentation, or rare scar following cryotherapy. Recommend Vaseline ointment to treated areas while healing.    Return in about 1 year (around 05/31/2024) for TBSE, HxBCC/SCC/DN; 1 month recheck lesion.  I, Lawson Radar, CMA, am acting as scribe for Elie Goody, MD.   Documentation: I have reviewed the above documentation for accuracy and completeness, and I agree with the above.  Elie Goody, MD

## 2023-06-01 NOTE — Patient Instructions (Signed)
Cryotherapy Aftercare  Wash gently with soap and water everyday.   Apply Vaseline and Band-Aid daily until healed.   Wound Care Instructions  Cleanse wound gently with soap and water once a day then pat dry with clean gauze. Apply a thin coat of Petrolatum (petroleum jelly, "Vaseline") over the wound (unless you have an allergy to this). We recommend that you use a new, sterile tube of Vaseline. Do not pick or remove scabs. Do not remove the yellow or white "healing tissue" from the base of the wound.  Cover the wound with fresh, clean, nonstick gauze and secure with paper tape. You may use Band-Aids in place of gauze and tape if the wound is small enough, but would recommend trimming much of the tape off as there is often too much. Sometimes Band-Aids can irritate the skin.  You should call the office for your biopsy report after 1 week if you have not already been contacted.  If you experience any problems, such as abnormal amounts of bleeding, swelling, significant bruising, significant pain, or evidence of infection, please call the office immediately.  FOR ADULT SURGERY PATIENTS: If you need something for pain relief you may take 1 extra strength Tylenol (acetaminophen) AND 2 Ibuprofen (200mg  each) together every 4 hours as needed for pain. (do not take these if you are allergic to them or if you have a reason you should not take them.) Typically, you may only need pain medication for 1 to 3 days.      Recommend daily broad spectrum sunscreen SPF 30+ to sun-exposed areas, reapply every 2 hours as needed. Call for new or changing lesions.  Staying in the shade or wearing long sleeves, sun glasses (UVA+UVB protection) and wide brim hats (4-inch brim around the entire circumference of the hat) are also recommended for sun protection.    Melanoma ABCDEs  Melanoma is the most dangerous type of skin cancer, and is the leading cause of death from skin disease.  You are more likely to develop  melanoma if you: Have light-colored skin, light-colored eyes, or red or blond hair Spend a lot of time in the sun Tan regularly, either outdoors or in a tanning bed Have had blistering sunburns, especially during childhood Have a close family member who has had a melanoma Have atypical moles or large birthmarks  Early detection of melanoma is key since treatment is typically straightforward and cure rates are extremely high if we catch it early.   The first sign of melanoma is often a change in a mole or a new dark spot.  The ABCDE system is a way of remembering the signs of melanoma.  A for asymmetry:  The two halves do not match. B for border:  The edges of the growth are irregular. C for color:  A mixture of colors are present instead of an even brown color. D for diameter:  Melanomas are usually (but not always) greater than 6mm - the size of a pencil eraser. E for evolution:  The spot keeps changing in size, shape, and color.  Please check your skin once per month between visits. You can use a small mirror in front and a large mirror behind you to keep an eye on the back side or your body.   If you see any new or changing lesions before your next follow-up, please call to schedule a visit.  Please continue daily skin protection including broad spectrum sunscreen SPF 30+ to sun-exposed areas, reapplying every 2 hours  as needed when you're outdoors.   Staying in the shade or wearing long sleeves, sun glasses (UVA+UVB protection) and wide brim hats (4-inch brim around the entire circumference of the hat) are also recommended for sun protection.    Due to recent changes in healthcare laws, you may see results of your pathology and/or laboratory studies on MyChart before the doctors have had a chance to review them. We understand that in some cases there may be results that are confusing or concerning to you. Please understand that not all results are received at the same time and often the  doctors may need to interpret multiple results in order to provide you with the best plan of care or course of treatment. Therefore, we ask that you please give [place] 2 business days to thoroughly review all your results before contacting the office for clarification. Should we see a critical lab result, you will be contacted sooner.   If You Need Anything After Your Visit  If you have any questions or concerns for your doctor, please call our main line at [phone] and press option 4 to reach your doctor's medical assistant. If no one answers, please leave a voicemail as directed and we will return your call as soon as possible. Messages left after 4 pm will be answered the following business day.   You may also send [place] a message via MyChart. We typically respond to MyChart messages within 1-2 business days.  For prescription refills, please ask your pharmacy to contact our office. Our fax number is [phone].  If you have an urgent issue when the clinic is closed that cannot wait until the next business day, you can page your doctor at the number below.    Please note that while we do our best to be available for urgent issues outside of office hours, we are not available 24/7.   If you have an urgent issue and are unable to reach [place], you may choose to seek medical care at your doctor's office, retail clinic, urgent care center, or emergency room.  If you have a medical emergency, please immediately call 911 or go to the emergency department.  Pager Numbers  - Dr. Gwen Pounds: [phone]  - Dr. Roseanne Reno: [phone]  - Dr. Katrinka Blazing: [phone]   In the event of inclement weather, please call our main line at [phone] for an update on the status of any delays or closures.  Dermatology Medication Tips: Please keep the boxes that topical medications come in in order to help keep track of the instructions about where and how to use these. Pharmacies typically print the medication  instructions only on the boxes and not directly on the medication tubes.   If your medication is too expensive, please contact our office at [phone] option 4 or send [place] a message through MyChart.   We are unable to tell what your co-pay for medications will be in advance as this is different depending on your insurance coverage. However, we may be able to find a substitute medication at lower cost or fill out paperwork to get insurance to cover a needed medication.   If a prior authorization is required to get your medication covered by your insurance company, please allow [place] 1-2 business days to complete this process.  Drug prices often vary depending on where the prescription is filled and some pharmacies may offer cheaper prices.  The website www.goodrx.com contains coupons for medications through different pharmacies. The prices here do not account for  what the cost may be with help from insurance (it may be cheaper with your insurance), but the website can give you the price if you did not use any insurance.  - You can print the associated coupon and take it with your prescription to the pharmacy.  - You may also stop by our office during regular business hours and pick up a GoodRx coupon card.  - If you need your prescription sent electronically to a different pharmacy, notify our office through [facility] or by phone at [phone] option 4.     Si Usted Necesita Algo Despus de Su Visita  Tambin puede enviarnos un mensaje a travs de Clinical cytogeneticist. Por lo general respondemos a los mensajes de MyChart en el transcurso de 1 a 2 das hbiles.  Para renovar recetas, por favor pida a su farmacia que se ponga en contacto con nuestra oficina. Annie Sable de fax es [place] [phone].  Si tiene un asunto urgente cuando la clnica est cerrada y que no puede esperar hasta el siguiente da hbil, puede llamar/localizar a su doctor(a) al nmero que aparece a continuacin.   Por  favor, tenga en cuenta que aunque hacemos todo lo posible para estar disponibles para asuntos urgentes fuera del horario de [place], no estamos disponibles las 24 horas del da, los 7 [address]  [address] de la [place].   Si tiene un problema urgente y no puede comunicarse con nosotros, puede optar por buscar atencin mdica  en el consultorio de su doctor(a), en una clnica privada, en un centro de atencin urgente o en una sala de emergencias.  Si tiene Engineer, drilling, por favor llame inmediatamente al 911 o vaya a la sala de emergencias.  Nmeros de bper  - Dr. Gwen Pounds: [phone]  - Dra. Roseanne Reno: [phone]  - Dr. Katrinka Blazing: [phone]   En caso de inclemencias del tiempo, por favor llame a Lacy Duverney principal al [phone] para una actualizacin sobre el [place] de cualquier retraso o cierre.  Consejos para la medicacin en dermatologa: Por favor, guarde las cajas en las que vienen los medicamentos de uso tpico para ayudarle a seguir las instrucciones sobre dnde y cmo usarlos. Las farmacias generalmente imprimen las instrucciones del medicamento slo en las cajas y no directamente en los tubos del [place].   Si su medicamento es muy caro, por favor, pngase en contacto con Rolm Gala llamando al [phone] y presione la opcin 4 o envenos un mensaje a travs de Clinical cytogeneticist.   No podemos decirle cul ser su copago por los medicamentos por adelantado ya que esto es diferente dependiendo de la cobertura de su seguro. Sin embargo, es posible que podamos encontrar un medicamento sustituto a Audiological scientist un formulario para que el seguro cubra el medicamento que se considera necesario.   Si se requiere una autorizacin previa para que su compaa de seguros [place] su medicamento, por favor permtanos de 1 a 2 das hbiles para completar [address]  Los precios de los medicamentos varan con frecuencia dependiendo del Environmental consultant de dnde se surte la receta y alguna farmacias  pueden ofrecer precios ms baratos.  El sitio web www.goodrx.com tiene cupones para medicamentos de Health and safety inspector. Los precios aqu no tienen en cuenta lo que podra costar con la ayuda del seguro (puede ser ms barato con su seguro), pero el sitio web puede darle el precio si no utiliz Tourist information centre manager.  - Puede imprimir el cupn correspondiente y llevarlo con su receta a la farmacia.  Laroy Apple  puede pasar por nuestra oficina durante el horario de atencin regular y Education officer, museum una tarjeta de cupones de GoodRx.  - Si necesita que su receta se enve electrnicamente a una farmacia diferente, informe a nuestra oficina a travs de MyChart de [place] o por telfono llamando al [phone] y presione la opcin 4.

## 2023-06-02 LAB — SURGICAL PATHOLOGY

## 2023-06-05 ENCOUNTER — Telehealth: Payer: Self-pay

## 2023-06-05 NOTE — Telephone Encounter (Signed)
Discussed pathology results. Patient voiced understanding. Will keep appt for December.

## 2023-06-05 NOTE — Telephone Encounter (Signed)
-----   Message from [place] sent at 06/03/2023  3:20 PM EST ----- Diagnosis: right mons pubis :       SEBORRHEIC KERATOSIS, IRRITATED    Plan: please call to share diagnosis. Biopsy shows benign thickening of the top layer of skin. We can remove more of it at the next appointment.

## 2023-06-05 NOTE — Telephone Encounter (Signed)
Called patient. N/A. LMOM to C/B for pathology results.

## 2023-06-26 ENCOUNTER — Encounter: Payer: Self-pay | Admitting: Cardiovascular Disease

## 2023-06-26 ENCOUNTER — Ambulatory Visit: Payer: Medicare HMO | Admitting: Cardiovascular Disease

## 2023-06-26 VITALS — BP 134/68 | HR 46 | Ht 70.0 in | Wt 178.4 lb

## 2023-06-26 DIAGNOSIS — R0602 Shortness of breath: Secondary | ICD-10-CM | POA: Diagnosis not present

## 2023-06-26 DIAGNOSIS — I10 Essential (primary) hypertension: Secondary | ICD-10-CM | POA: Diagnosis not present

## 2023-06-26 DIAGNOSIS — I2510 Atherosclerotic heart disease of native coronary artery without angina pectoris: Secondary | ICD-10-CM

## 2023-06-26 DIAGNOSIS — E119 Type 2 diabetes mellitus without complications: Secondary | ICD-10-CM

## 2023-06-26 DIAGNOSIS — R001 Bradycardia, unspecified: Secondary | ICD-10-CM

## 2023-06-26 NOTE — Progress Notes (Signed)
Cardiology Office Note   Date:  06/26/2023   ID:  Paul Gaines, DOB February 05, 1933, MRN [id number]  PCP:  Sherron Monday, MD  Cardiologist:  Adrian Blackwater, MD      History of Present Illness: Paul Gaines is a 89 y.o. male who presents for  Chief Complaint  Patient presents with   Follow-up    4 week follow up    Feeling fine      Past Medical History:  Diagnosis Date   Arthritis    Asthma    BCC (basal cell carcinoma) 09/15/2022   superficial at left upper back, [facility] 11/30/22   Cancer (HCC)    hx of skin cancer on nose   Chronic kidney disease    frequency   Coronary artery disease    stent - 2001    Dysplastic nevus 08/13/2014   left post distal deltoid   Hypertension    Legally blind    Myocardial infarction (HCC)    Pneumonia    hx of several times per pt    Shortness of breath    with exertion    Squamous cell carcinoma in situ (SCCIS) 05/24/2022   left temple, Mohs 07/20/2022   Squamous cell carcinoma of skin 07/01/2010   left infraorbital (in situ)     Past Surgical History:  Procedure Laterality Date   CORONARY ANGIOPLASTY     1988, stents    EYE SURGERY     right eye surgery , macular transrelocation    HERNIA REPAIR     KNEE ARTHROSCOPY     left    TONSILLECTOMY     TOTAL KNEE ARTHROPLASTY  03/26/2012   Procedure: TOTAL KNEE ARTHROPLASTY;  Surgeon: Loanne Drilling, MD;  Location: [facility];  Service: Orthopedics;  Laterality: Left;     Current Outpatient Medications  Medication Sig Dispense Refill   Accu-Chek Softclix Lancets lancets TEST TWO TIMES DAILY 200 each 3   amLODipine (NORVASC) 5 MG tablet TAKE 2 TABLETS ONE TIME DAILY 180 tablet 3   aspirin EC 81 MG tablet Take 81 mg by mouth daily.     carvedilol (COREG) 12.5 MG tablet TAKE 1 TABLET TWICE DAILY 180 tablet 3   dorzolamide-timolol (COSOPT) 2-0.5 % ophthalmic solution Place 1 drop into both eyes 2 (two) times daily.     fluticasone (FLONASE) 50 MCG/ACT nasal spray USE 1  SPRAY IN EACH NOSTRIL IN THE MORNING AS DIRECTED 32 g 3   losartan (COZAAR) 100 MG tablet Take 1 tablet (100 mg total) by mouth daily. 30 tablet 11   pravastatin (PRAVACHOL) 40 MG tablet TAKE 1 TABLET EVERY DAY 90 tablet 3   prednisoLONE acetate (PRED FORTE) 1 % ophthalmic suspension Place 1 drop into the right eye every morning.     Psyllium (METAMUCIL PO) Take 1 tablet by mouth every morning.     tamsulosin (FLOMAX) 0.4 MG CAPS capsule Take 1 capsule (0.4 mg total) by mouth every morning. 90 capsule 3   timolol (BETIMOL) 0.5 % ophthalmic solution Place 1 drop into both eyes every morning.     trospium (SANCTURA) 20 MG tablet Take 20 mg by mouth at bedtime.     iron polysaccharides (NIFEREX) 150 MG capsule Take 1 capsule (150 mg total) by mouth 2 (two) times daily. 42 capsule 0   No current facility-administered medications for this visit.    Allergies:   Celecoxib and Lisinopril    Social History:   reports that he  quit smoking about 46 years ago. His smoking use included cigarettes. He has never used smokeless tobacco. He reports that he does not drink alcohol and does not use drugs.   Family History:  family history is not on file.    ROS:     Review of Systems  Constitutional: Negative.   HENT: Negative.    Eyes: Negative.   Respiratory: Negative.    Gastrointestinal: Negative.   Genitourinary: Negative.   Musculoskeletal: Negative.   Skin: Negative.   Neurological: Negative.   Endo/Heme/Allergies: Negative.   Psychiatric/Behavioral: Negative.    All other systems reviewed and are negative.     All other systems are reviewed and negative.    PHYSICAL EXAM: VS:  BP 134/68   Pulse (!) 46   Ht 5\' 10"  (1.778 m)   Wt 178 lb 6.4 oz (80.9 kg)   SpO2 96%   BMI 25.60 kg/m  , BMI Body mass index is 25.6 kg/m. Last weight:  Wt Readings from Last 3 Encounters:  06/26/23 178 lb 6.4 oz (80.9 kg)  05/29/23 180 lb 12.8 oz (82 kg)  05/22/23 179 lb 9.6 oz (81.5 kg)      Physical Exam Vitals reviewed.  Constitutional:      Appearance: Normal appearance. He is normal weight.  HENT:     Head: Normocephalic.     Nose: Nose normal.     Mouth/Throat:     Mouth: Mucous membranes are moist.  Eyes:     Pupils: Pupils are equal, round, and reactive to light.  Cardiovascular:     Rate and Rhythm: Normal rate and regular rhythm.     Pulses: Normal pulses.     Heart sounds: Normal heart sounds.  Pulmonary:     Effort: Pulmonary effort is normal.  Abdominal:     General: Abdomen is flat. Bowel sounds are normal.  Musculoskeletal:        General: Normal range of motion.     Cervical back: Normal range of motion.  Skin:    General: Skin is warm.  Neurological:     General: No focal deficit present.     Mental Status: He is alert.  Psychiatric:        Mood and Affect: Mood normal.       EKG:   Recent Labs: 05/22/2023: ALT 10; BUN 29; Creatinine, Ser 1.26; Potassium 4.8; Sodium 143    Lipid Panel    Component Value Date/Time   CHOL 126 05/22/2023 0840   TRIG 66 05/22/2023 0840   HDL 66 05/22/2023 0840   CHOLHDL 1.9 05/22/2023 0840   LDLCALC 46 05/22/2023 0840      Other studies Reviewed: Additional studies/ records that were reviewed today include:  Review of the above records demonstrates:       No data to display            ASSESSMENT AND PLAN:  No diagnosis found.   Problem List Items Addressed This Visit   None      Disposition:   No follow-ups on file.    Total time spent: 30 minutes  Signed,  Adrian Blackwater, MD  06/26/2023 1:53 PM    [facility]

## 2023-06-27 ENCOUNTER — Ambulatory Visit (INDEPENDENT_AMBULATORY_CARE_PROVIDER_SITE_OTHER): Payer: Medicare HMO | Admitting: Internal Medicine

## 2023-06-27 ENCOUNTER — Encounter: Payer: Self-pay | Admitting: Internal Medicine

## 2023-06-27 ENCOUNTER — Telehealth: Payer: Self-pay

## 2023-06-27 VITALS — BP 114/72 | HR 52 | Ht 70.0 in | Wt 177.8 lb

## 2023-06-27 DIAGNOSIS — E782 Mixed hyperlipidemia: Secondary | ICD-10-CM | POA: Insufficient documentation

## 2023-06-27 DIAGNOSIS — Z1331 Encounter for screening for depression: Secondary | ICD-10-CM | POA: Diagnosis not present

## 2023-06-27 DIAGNOSIS — N138 Other obstructive and reflux uropathy: Secondary | ICD-10-CM

## 2023-06-27 DIAGNOSIS — N401 Enlarged prostate with lower urinary tract symptoms: Secondary | ICD-10-CM

## 2023-06-27 DIAGNOSIS — I10 Essential (primary) hypertension: Secondary | ICD-10-CM | POA: Insufficient documentation

## 2023-06-27 DIAGNOSIS — Z0001 Encounter for general adult medical examination with abnormal findings: Secondary | ICD-10-CM

## 2023-06-27 NOTE — Progress Notes (Signed)
Established Patient Office Visit  Subjective:  Patient ID: Paul Gaines, male    DOB: June 09, 1933  Age: 89 y.o. MRN: [id number]  No chief complaint on file.   No new complaints, here for AWV refer to quality metrics and scanned documents.  Lesion on his leg was biopsied and path was benign.     No other concerns at this time.   Past Medical History:  Diagnosis Date   Arthritis    Asthma    BCC (basal cell carcinoma) 09/15/2022   superficial at left upper back, [facility] 11/30/22   Cancer (HCC)    hx of skin cancer on nose   Chronic kidney disease    frequency   Coronary artery disease    stent - 2001    Dysplastic nevus 08/13/2014   left post distal deltoid   Hypertension    Legally blind    Myocardial infarction (HCC)    Pneumonia    hx of several times per pt    Shortness of breath    with exertion    Squamous cell carcinoma in situ (SCCIS) 05/24/2022   left temple, Mohs 07/20/2022   Squamous cell carcinoma of skin 07/01/2010   left infraorbital (in situ)    Past Surgical History:  Procedure Laterality Date   CORONARY ANGIOPLASTY     1988, stents    EYE SURGERY     right eye surgery , macular transrelocation    HERNIA REPAIR     KNEE ARTHROSCOPY     left    TONSILLECTOMY     TOTAL KNEE ARTHROPLASTY  03/26/2012   Procedure: TOTAL KNEE ARTHROPLASTY;  Surgeon: Loanne Drilling, MD;  Location: [facility];  Service: Orthopedics;  Laterality: Left;    Social History   Socioeconomic History   Marital status: Married    Spouse name: Not on file   Number of children: Not on file   Years of education: Not on file   Highest education level: Not on file  Occupational History   Not on file  Tobacco Use   Smoking status: Former    Current packs/day: 0.00    Types: Cigarettes    Quit date: 07/18/1976    Years since quitting: 46.9   Smokeless tobacco: Never  Substance and Sexual Activity   Alcohol use: Never    Comment: infrequent per pt    Drug use: No   Sexual  activity: Not on file  Other Topics Concern   Not on file  Social History Narrative   Not on file   Social Determinants of Health   Financial Resource Strain: Not on file  Food Insecurity: Not on file  Transportation Needs: Not on file  Physical Activity: Not on file  Stress: Not on file  Social Connections: Not on file  Intimate Partner Violence: Not on file    No family history on file.  Allergies  Allergen Reactions   Celecoxib     Reaction=heart attack per patient   Lisinopril     Outpatient Medications Prior to Visit  Medication Sig   Accu-Chek Softclix Lancets lancets TEST TWO TIMES DAILY   aspirin EC 81 MG tablet Take 81 mg by mouth daily.   carvedilol (COREG) 12.5 MG tablet TAKE 1 TABLET TWICE DAILY   dorzolamide-timolol (COSOPT) 2-0.5 % ophthalmic solution Place 1 drop into both eyes 2 (two) times daily.   fluticasone (FLONASE) 50 MCG/ACT nasal spray USE 1 SPRAY IN EACH NOSTRIL IN THE MORNING AS DIRECTED  iron polysaccharides (NIFEREX) 150 MG capsule Take 1 capsule (150 mg total) by mouth 2 (two) times daily.   losartan (COZAAR) 100 MG tablet Take 1 tablet (100 mg total) by mouth daily.   pravastatin (PRAVACHOL) 40 MG tablet TAKE 1 TABLET EVERY DAY   prednisoLONE acetate (PRED FORTE) 1 % ophthalmic suspension Place 1 drop into the right eye every morning.   Psyllium (METAMUCIL PO) Take 1 tablet by mouth every morning.   tamsulosin (FLOMAX) 0.4 MG CAPS capsule Take 1 capsule (0.4 mg total) by mouth every morning.   timolol (BETIMOL) 0.5 % ophthalmic solution Place 1 drop into both eyes every morning.   trospium (SANCTURA) 20 MG tablet Take 20 mg by mouth at bedtime.   No facility-administered medications prior to visit.    Review of Systems  Constitutional:  Negative for malaise/fatigue.  HENT: Negative.    Eyes: Negative.   Respiratory:  Negative for cough.   Cardiovascular:  Negative for chest pain and palpitations.  Gastrointestinal: Negative.    Neurological:  Negative for dizziness.  Psychiatric/Behavioral: Negative.    All other systems reviewed and are negative.      Objective:   BP 114/72   Pulse (!) 52   Ht 5\' 10"  (1.778 m)   Wt 177 lb 12.8 oz (80.6 kg)   SpO2 97%   BMI 25.51 kg/m   Vitals:   06/27/23 1402  BP: 114/72  Pulse: (!) 52  Height: 5\' 10"  (1.778 m)  Weight: 177 lb 12.8 oz (80.6 kg)  SpO2: 97%  BMI (Calculated): 25.51    Physical Exam Vitals reviewed.  Constitutional:      Appearance: Normal appearance.  HENT:     Head: Normocephalic.     Left Ear: There is no impacted cerumen.     Nose: Nose normal.     Mouth/Throat:     Mouth: Mucous membranes are moist.     Pharynx: No posterior oropharyngeal erythema.  Eyes:     Comments: Poor visual acuity bilaterally  Cardiovascular:     Rate and Rhythm: Regular rhythm.     Chest Wall: PMI is not displaced.     Pulses: Normal pulses.     Heart sounds: Normal heart sounds. No murmur heard. Pulmonary:     Effort: Pulmonary effort is normal.     Breath sounds: Normal air entry. No rhonchi or rales.  Abdominal:     General: Abdomen is flat. Bowel sounds are normal. There is no distension.     Palpations: Abdomen is soft. There is no hepatomegaly, splenomegaly or mass.     Tenderness: There is no abdominal tenderness.  Musculoskeletal:        General: Normal range of motion.     Cervical back: Normal range of motion and neck supple.     Right lower leg: No edema.     Left lower leg: No edema.  Skin:    General: Skin is warm and dry.     Comments: Wart like lesion on right groin.  Neurological:     General: No focal deficit present.     Mental Status: He is alert and oriented to person, place, and time.     Cranial Nerves: No cranial nerve deficit.     Motor: No weakness.  Psychiatric:        Mood and Affect: Mood normal.        Behavior: Behavior normal.      No results found for any visits on 06/27/23.  Recent Results (from the past  2160 hour(Lena Fieldhouse))  Hemoglobin A1c     Status: Abnormal   Collection Time: 05/22/23  8:40 AM  Result Value Ref Range   Hgb A1c MFr Bld 5.8 (H) 4.8 - 5.6 %    Comment:          Prediabetes: 5.7 - 6.4          Diabetes: >6.4          Glycemic control for adults with diabetes: <7.0    Est. average glucose Bld gHb Est-mCnc 120 mg/dL  Lipid panel     Status: None   Collection Time: 05/22/23  8:40 AM  Result Value Ref Range   Cholesterol, Total 126 100 - 199 mg/dL   Triglycerides 66 0 - 149 mg/dL   HDL 66 >78 mg/dL   VLDL Cholesterol Cal 14 5 - 40 mg/dL   LDL Chol Calc (NIH) 46 0 - 99 mg/dL   Chol/HDL Ratio 1.9 0.0 - 5.0 ratio    Comment:                                   T. Chol/HDL Ratio                                             Men  Women                               1/2 Avg.Risk  3.4    3.3                                   Avg.Risk  5.0    4.4                                2X Avg.Risk  9.6    7.1                                3X Avg.Risk 23.4   11.0   Comprehensive metabolic panel     Status: Abnormal   Collection Time: 05/22/23  8:40 AM  Result Value Ref Range   Glucose 99 70 - 99 mg/dL   BUN 29 (H) 8 - 27 mg/dL   Creatinine, Ser 2.95 0.76 - 1.27 mg/dL   eGFR 55 (L) >62 ZH/YQM/5.78   BUN/Creatinine Ratio 23 10 - 24   Sodium 143 134 - 144 mmol/L   Potassium 4.8 3.5 - 5.2 mmol/L   Chloride 109 (H) 96 - 106 mmol/L   CO2 22 20 - 29 mmol/L   Calcium 9.4 8.6 - 10.2 mg/dL   Total Protein 6.1 6.0 - 8.5 g/dL   Albumin 3.8 3.7 - 4.7 g/dL   Globulin, Total 2.3 1.5 - 4.5 g/dL   Bilirubin Total 0.4 0.0 - 1.2 mg/dL   Alkaline Phosphatase 56 44 - 121 IU/L   AST 8 0 - 40 IU/L   ALT 10 0 - 44 IU/L  POCT CBG (Fasting - Glucose)     Status: Abnormal   Collection Time: 05/22/23  2:06  PM  Result Value Ref Range   Glucose Fasting, POC 116 (A) 70 - 99 mg/dL                        Assessment & Plan:  As per problem list  Problem List Items Addressed This Visit        Cardiovascular and Mediastinum   Primary hypertension - Primary     Genitourinary   BPH with obstruction/lower urinary tract symptoms     Other   Mixed hyperlipidemia   Relevant Orders   Lipid panel   Comprehensive metabolic panel    Return in about 3 months (around 09/25/2023) for fu with labs prior.   Total time spent: 35 minutes  Luna Fuse, MD  06/27/2023   This document may have been prepared by [facility] Voice Recognition software and as such may include unintentional dictation errors.

## 2023-06-27 NOTE — Telephone Encounter (Signed)
Pt was seen in office today by tejan-sie, after his visit he stopped at the nurse desk and said that you stopped him losartan 12/9 but per you office notes it says to stop the amlodipine 5mg , please advise and confirm that he should be taking losartan and stopping amlodipine 5mg 

## 2023-07-03 ENCOUNTER — Encounter: Payer: Self-pay | Admitting: Dermatology

## 2023-07-03 ENCOUNTER — Ambulatory Visit: Payer: Medicare HMO | Admitting: Dermatology

## 2023-07-03 DIAGNOSIS — L820 Inflamed seborrheic keratosis: Secondary | ICD-10-CM

## 2023-07-03 NOTE — Progress Notes (Signed)
   Follow-Up Visit   Subjective  Paul Gaines is a 89 y.o. male who presents for the following: ISK. Right mons pubis. Bx proven 06/01/2023. Here for additional treatment.   The patient has spots, moles and lesions to be evaluated, some may be new or changing and the patient may have concern these could be cancer.    The following portions of the chart were reviewed this encounter and updated as appropriate: medications, allergies, medical history  Review of Systems:  No other skin or systemic complaints except as noted in HPI or Assessment and Plan.  Objective  Well appearing patient in no apparent distress; mood and affect are within normal limits.  A focused examination was performed of the following areas: Groin   Relevant physical exam findings are noted in the Assessment and Plan.  right mons pubis, left temporal scalp (2) Erythematous keratotic or waxy stuck-on papule or plaque.  Assessment & Plan   INFLAMED SEBORRHEIC KERATOSIS (2) right mons pubis, left temporal scalp (2) Symptomatic, irritating, patient would like treated.  Biopsy proven at right mons pubis Destruction of lesion - right mons pubis, left temporal scalp (2) Complexity: simple   Destruction method: cryotherapy   Informed consent: discussed and consent obtained   Timeout:  patient name, date of birth, surgical site, and procedure verified Lesion destroyed using liquid nitrogen: Yes   Region frozen until ice ball extended beyond lesion: Yes   Cryo cycles: 1 or 2. Outcome: patient tolerated procedure well with no complications   Post-procedure details: wound care instructions given     Return for as scheduled.  I, Lawson Radar, CMA, am acting as scribe for Elie Goody, MD.   Documentation: I have reviewed the above documentation for accuracy and completeness, and I agree with the above.  Elie Goody, MD

## 2023-07-03 NOTE — Patient Instructions (Signed)
Prior to procedure, discussed risks of blister formation, small wound, skin dyspigmentation, or rare scar following cryotherapy. Recommend Vaseline ointment to treated areas while healing.   Due to recent changes in healthcare laws, you may see results of your pathology and/or laboratory studies on MyChart before the doctors have had a chance to review them. We understand that in some cases there may be results that are confusing or concerning to you. Please understand that not all results are received at the same time and often the doctors may need to interpret multiple results in order to provide you with the best plan of care or course of treatment. Therefore, we ask that you please give [place] 2 business days to thoroughly review all your results before contacting the office for clarification. Should we see a critical lab result, you will be contacted sooner.   If You Need Anything After Your Visit  If you have any questions or concerns for your doctor, please call our main line at [phone] and press option 4 to reach your doctor's medical assistant. If no one answers, please leave a voicemail as directed and we will return your call as soon as possible. Messages left after 4 pm will be answered the following business day.   You may also send [place] a message via MyChart. We typically respond to MyChart messages within 1-2 business days.  For prescription refills, please ask your pharmacy to contact our office. Our fax number is [phone].  If you have an urgent issue when the clinic is closed that cannot wait until the next business day, you can page your doctor at the number below.    Please note that while we do our best to be available for urgent issues outside of office hours, we are not available 24/7.   If you have an urgent issue and are unable to reach [place], you may choose to seek medical care at your doctor's office, retail clinic, urgent care center, or emergency room.  If you have a  medical emergency, please immediately call 911 or go to the emergency department.  Pager Numbers  - Dr. Gwen Pounds: [phone]  - Dr. Roseanne Reno: [phone]  - Dr. Katrinka Blazing: [phone]   In the event of inclement weather, please call our main line at [phone] for an update on the status of any delays or closures.  Dermatology Medication Tips: Please keep the boxes that topical medications come in in order to help keep track of the instructions about where and how to use these. Pharmacies typically print the medication instructions only on the boxes and not directly on the medication tubes.   If your medication is too expensive, please contact our office at [phone] option 4 or send [place] a message through MyChart.   We are unable to tell what your co-pay for medications will be in advance as this is different depending on your insurance coverage. However, we may be able to find a substitute medication at lower cost or fill out paperwork to get insurance to cover a needed medication.   If a prior authorization is required to get your medication covered by your insurance company, please allow [place] 1-2 business days to complete this process.  Drug prices often vary depending on where the prescription is filled and some pharmacies may offer cheaper prices.  The website www.goodrx.com contains coupons for medications through different pharmacies. The prices here do not account for what the cost may be with help from insurance (it may be cheaper with your  insurance), but the website can give you the price if you did not use any insurance.  - You can print the associated coupon and take it with your prescription to the pharmacy.  - You may also stop by our office during regular business hours and pick up a GoodRx coupon card.  - If you need your prescription sent electronically to a different pharmacy, notify our office through [facility] or by phone at [phone] option 4.     Si  Usted Necesita Algo Despus de Su Visita  Tambin puede enviarnos un mensaje a travs de Clinical cytogeneticist. Por lo general respondemos a los mensajes de MyChart en el transcurso de 1 a 2 das hbiles.  Para renovar recetas, por favor pida a su farmacia que se ponga en contacto con nuestra oficina. Annie Sable de fax es [place] [phone].  Si tiene un asunto urgente cuando la clnica est cerrada y que no puede esperar hasta el siguiente da hbil, puede llamar/localizar a su doctor(a) al nmero que aparece a continuacin.   Por favor, tenga en cuenta que aunque hacemos todo lo posible para estar disponibles para asuntos urgentes fuera del horario de [place], no estamos disponibles las 24 horas del da, los 7 [address]  [address] de la [place].   Si tiene un problema urgente y no puede comunicarse con nosotros, puede optar por buscar atencin mdica  en el consultorio de su doctor(a), en una clnica privada, en un centro de atencin urgente o en una sala de emergencias.  Si tiene Engineer, drilling, por favor llame inmediatamente al 911 o vaya a la sala de emergencias.  Nmeros de bper  - Dr. Gwen Pounds: [phone]  - Dra. Roseanne Reno: [phone]  - Dr. Katrinka Blazing: [phone]   En caso de inclemencias del tiempo, por favor llame a Lacy Duverney principal al [phone] para una actualizacin sobre el [place] de cualquier retraso o cierre.  Consejos para la medicacin en dermatologa: Por favor, guarde las cajas en las que vienen los medicamentos de uso tpico para ayudarle a seguir las instrucciones sobre dnde y cmo usarlos. Las farmacias generalmente imprimen las instrucciones del medicamento slo en las cajas y no directamente en los tubos del [place].   Si su medicamento es muy caro, por favor, pngase en contacto con Rolm Gala llamando al [phone] y presione la opcin 4 o envenos un mensaje a travs de Clinical cytogeneticist.   No podemos decirle cul ser su copago por los medicamentos por adelantado ya que  esto es diferente dependiendo de la cobertura de su seguro. Sin embargo, es posible que podamos encontrar un medicamento sustituto a Audiological scientist un formulario para que el seguro cubra el medicamento que se considera necesario.   Si se requiere una autorizacin previa para que su compaa de seguros [place] su medicamento, por favor permtanos de 1 a 2 das hbiles para completar [address]  Los precios de los medicamentos varan con frecuencia dependiendo del Environmental consultant de dnde se surte la receta y alguna farmacias pueden ofrecer precios ms baratos.  El sitio web www.goodrx.com tiene cupones para medicamentos de Health and safety inspector. Los precios aqu no tienen en cuenta lo que podra costar con la ayuda del seguro (puede ser ms barato con su seguro), pero el sitio web puede darle el precio si no utiliz Tourist information centre manager.  - Puede imprimir el cupn correspondiente y llevarlo con su receta a la farmacia.  - Tambin puede pasar por nuestra oficina durante el horario de atencin regular y Librarian, academic tarjeta  de cupones de GoodRx.  - Si necesita que su receta se enve electrnicamente a una farmacia diferente, informe a nuestra oficina a travs de MyChart de [place] o por telfono llamando al [phone] y presione la opcin 4.

## 2023-07-11 ENCOUNTER — Other Ambulatory Visit: Payer: Self-pay

## 2023-07-11 DIAGNOSIS — I10 Essential (primary) hypertension: Secondary | ICD-10-CM

## 2023-07-11 DIAGNOSIS — R0602 Shortness of breath: Secondary | ICD-10-CM

## 2023-07-11 DIAGNOSIS — I2510 Atherosclerotic heart disease of native coronary artery without angina pectoris: Secondary | ICD-10-CM

## 2023-07-11 DIAGNOSIS — E119 Type 2 diabetes mellitus without complications: Secondary | ICD-10-CM

## 2023-07-13 ENCOUNTER — Other Ambulatory Visit: Payer: Self-pay

## 2023-07-13 DIAGNOSIS — R0602 Shortness of breath: Secondary | ICD-10-CM

## 2023-07-13 DIAGNOSIS — I2510 Atherosclerotic heart disease of native coronary artery without angina pectoris: Secondary | ICD-10-CM

## 2023-07-13 DIAGNOSIS — I10 Essential (primary) hypertension: Secondary | ICD-10-CM

## 2023-07-13 DIAGNOSIS — E119 Type 2 diabetes mellitus without complications: Secondary | ICD-10-CM

## 2023-07-13 MED ORDER — LOSARTAN POTASSIUM 100 MG PO TABS: 100.0000 mg | ORAL_TABLET | Freq: Every day | ORAL | 11 refills | Status: DC

## 2023-07-28 DIAGNOSIS — H2512 Age-related nuclear cataract, left eye: Secondary | ICD-10-CM | POA: Diagnosis not present

## 2023-07-28 DIAGNOSIS — E119 Type 2 diabetes mellitus without complications: Secondary | ICD-10-CM | POA: Diagnosis not present

## 2023-07-28 DIAGNOSIS — H401134 Primary open-angle glaucoma, bilateral, indeterminate stage: Secondary | ICD-10-CM | POA: Diagnosis not present

## 2023-07-28 DIAGNOSIS — H353232 Exudative age-related macular degeneration, bilateral, with inactive choroidal neovascularization: Secondary | ICD-10-CM | POA: Diagnosis not present

## 2023-09-18 ENCOUNTER — Ambulatory Visit (INDEPENDENT_AMBULATORY_CARE_PROVIDER_SITE_OTHER): Payer: Medicare HMO | Admitting: Internal Medicine

## 2023-09-18 ENCOUNTER — Encounter: Payer: Self-pay | Admitting: Internal Medicine

## 2023-09-18 VITALS — BP 130/82 | HR 49 | Ht 70.0 in | Wt 172.0 lb

## 2023-09-18 DIAGNOSIS — N401 Enlarged prostate with lower urinary tract symptoms: Secondary | ICD-10-CM | POA: Diagnosis not present

## 2023-09-18 DIAGNOSIS — I10 Essential (primary) hypertension: Secondary | ICD-10-CM | POA: Diagnosis not present

## 2023-09-18 DIAGNOSIS — E782 Mixed hyperlipidemia: Secondary | ICD-10-CM | POA: Diagnosis not present

## 2023-09-18 DIAGNOSIS — N138 Other obstructive and reflux uropathy: Secondary | ICD-10-CM

## 2023-09-18 NOTE — Progress Notes (Signed)
 Established Patient Office Visit  Subjective:  Patient ID: Paul Gaines, male    DOB: May 11, 1933  Age: 89 y.o. MRN: [id number]  Chief Complaint  Patient presents with   Follow-up    3 month follow up    No new complaints, here for lab review and medication refills. Failed to have previsit labs done.     No other concerns at this time.   Past Medical History:  Diagnosis Date   Arthritis    Asthma    BCC (basal cell carcinoma) 09/15/2022   superficial at left upper back, EDC 11/30/22   Cancer (HCC)    hx of skin cancer on nose   Chronic kidney disease    frequency   Coronary artery disease    stent - 2001    Dysplastic nevus 08/13/2014   left post distal deltoid   Hypertension    Legally blind    Myocardial infarction (HCC)    Pneumonia    hx of several times per pt    Shortness of breath    with exertion    Squamous cell carcinoma in situ (SCCIS) 05/24/2022   left temple, Mohs 07/20/2022   Squamous cell carcinoma of skin 07/01/2010   left infraorbital (in situ)    Past Surgical History:  Procedure Laterality Date   CORONARY ANGIOPLASTY     1988, stents    EYE SURGERY     right eye surgery , macular transrelocation    HERNIA REPAIR     KNEE ARTHROSCOPY     left    TONSILLECTOMY     TOTAL KNEE ARTHROPLASTY  03/26/2012   Procedure: TOTAL KNEE ARTHROPLASTY;  Surgeon: Loanne Drilling, MD;  Location: [facility];  Service: Orthopedics;  Laterality: Left;    Social History   Socioeconomic History   Marital status: Married    Spouse name: Not on file   Number of children: Not on file   Years of education: Not on file   Highest education level: Not on file  Occupational History   Not on file  Tobacco Use   Smoking status: Former    Current packs/day: 0.00    Types: Cigarettes    Quit date: 07/18/1976    Years since quitting: 47.2   Smokeless tobacco: Never  Substance and Sexual Activity   Alcohol use: Never    Comment: infrequent per pt    Drug use: No    Sexual activity: Not on file  Other Topics Concern   Not on file  Social History Narrative   Not on file   Social Drivers of Health   Financial Resource Strain: Not on file  Food Insecurity: Not on file  Transportation Needs: Not on file  Physical Activity: Not on file  Stress: Not on file  Social Connections: Not on file  Intimate Partner Violence: Not on file    No family history on file.  Allergies  Allergen Reactions   Celecoxib     Reaction=heart attack per patient   Lisinopril     Outpatient Medications Prior to Visit  Medication Sig   Accu-Chek Softclix Lancets lancets TEST TWO TIMES DAILY   aspirin EC 81 MG tablet Take 81 mg by mouth daily.   carvedilol (COREG) 12.5 MG tablet TAKE 1 TABLET TWICE DAILY   dorzolamide-timolol (COSOPT) 2-0.5 % ophthalmic solution Place 1 drop into both eyes 2 (two) times daily.   fluticasone (FLONASE) 50 MCG/ACT nasal spray USE 1 SPRAY IN EACH NOSTRIL IN THE  MORNING AS DIRECTED   losartan (COZAAR) 100 MG tablet Take 1 tablet (100 mg total) by mouth daily.   pravastatin (PRAVACHOL) 40 MG tablet TAKE 1 TABLET EVERY DAY   prednisoLONE acetate (PRED FORTE) 1 % ophthalmic suspension Place 1 drop into the right eye every morning.   Psyllium (METAMUCIL PO) Take 1 tablet by mouth every morning.   tamsulosin (FLOMAX) 0.4 MG CAPS capsule Take 1 capsule (0.4 mg total) by mouth every morning.   timolol (BETIMOL) 0.5 % ophthalmic solution Place 1 drop into both eyes every morning.   trospium (SANCTURA) 20 MG tablet Take 20 mg by mouth at bedtime.   iron polysaccharides (NIFEREX) 150 MG capsule Take 1 capsule (150 mg total) by mouth 2 (two) times daily.   No facility-administered medications prior to visit.    Review of Systems  Constitutional:  Negative for malaise/fatigue.  HENT: Negative.    Eyes: Negative.   Respiratory:  Negative for cough.   Cardiovascular:  Negative for chest pain and palpitations.  Gastrointestinal: Negative.    Neurological:  Negative for dizziness.  Psychiatric/Behavioral: Negative.    All other systems reviewed and are negative.      Objective:   BP 130/82   Pulse (!) 49   Ht 5\' 10"  (1.778 m)   Wt 172 lb (78 kg)   SpO2 99%   BMI 24.68 kg/m   Vitals:   09/18/23 1302  BP: 130/82  Pulse: (!) 49  Height: 5\' 10"  (1.778 m)  Weight: 172 lb (78 kg)  SpO2: 99%  BMI (Calculated): 24.68    Physical Exam Vitals reviewed.  Constitutional:      Appearance: Normal appearance.  HENT:     Head: Normocephalic.     Left Ear: There is no impacted cerumen.     Nose: Nose normal.     Mouth/Throat:     Mouth: Mucous membranes are moist.     Pharynx: No posterior oropharyngeal erythema.  Eyes:     Comments: Poor visual acuity bilaterally  Cardiovascular:     Rate and Rhythm: Regular rhythm.     Chest Wall: PMI is not displaced.     Pulses: Normal pulses.     Heart sounds: Normal heart sounds. No murmur heard. Pulmonary:     Effort: Pulmonary effort is normal.     Breath sounds: Normal air entry. No rhonchi or rales.  Abdominal:     General: Abdomen is flat. Bowel sounds are normal. There is no distension.     Palpations: Abdomen is soft. There is no hepatomegaly, splenomegaly or mass.     Tenderness: There is no abdominal tenderness.  Musculoskeletal:        General: Normal range of motion.     Cervical back: Normal range of motion and neck supple.     Right lower leg: No edema.     Left lower leg: No edema.  Skin:    General: Skin is warm and dry.     Comments: Wart like lesion on right groin.  Neurological:     General: No focal deficit present.     Mental Status: He is alert and oriented to person, place, and time.     Cranial Nerves: No cranial nerve deficit.     Motor: No weakness.  Psychiatric:        Mood and Affect: Mood normal.        Behavior: Behavior normal.      No results found for any visits on  09/18/23.  No results found for this or any previous visit  (from the past 2160 hours).    Assessment & Plan:  As per problem list  Problem List Items Addressed This Visit       Cardiovascular and Mediastinum   Primary hypertension - Primary     Genitourinary   BPH with obstruction/lower urinary tract symptoms     Other   Mixed hyperlipidemia    Return in about 1 week (around 09/25/2023) for lab results.   Total time spent: 20 minutes  Luna Fuse, MD  09/18/2023   This document may have been prepared by [facility] Voice Recognition software and as such may include unintentional dictation errors.

## 2023-09-19 ENCOUNTER — Other Ambulatory Visit

## 2023-09-19 DIAGNOSIS — E782 Mixed hyperlipidemia: Secondary | ICD-10-CM | POA: Diagnosis not present

## 2023-09-20 LAB — COMPREHENSIVE METABOLIC PANEL
ALT: 8 IU/L (ref 0–44)
AST: 9 IU/L (ref 0–40)
Albumin: 4 g/dL (ref 3.7–4.7)
Alkaline Phosphatase: 61 IU/L (ref 44–121)
BUN/Creatinine Ratio: 17 (ref 10–24)
BUN: 22 mg/dL (ref 8–27)
Bilirubin Total: 0.4 mg/dL (ref 0.0–1.2)
CO2: 24 mmol/L (ref 20–29)
Calcium: 9.1 mg/dL (ref 8.6–10.2)
Chloride: 108 mmol/L — ABNORMAL HIGH (ref 96–106)
Creatinine, Ser: 1.33 mg/dL — ABNORMAL HIGH (ref 0.76–1.27)
Globulin, Total: 2 g/dL (ref 1.5–4.5)
Glucose: 90 mg/dL (ref 70–99)
Potassium: 4.6 mmol/L (ref 3.5–5.2)
Sodium: 144 mmol/L (ref 134–144)
Total Protein: 6 g/dL (ref 6.0–8.5)
eGFR: 51 mL/min/{1.73_m2} — ABNORMAL LOW (ref >59)

## 2023-09-20 LAB — LIPID PANEL
Chol/HDL Ratio: 1.9 ratio (ref 0.0–5.0)
Cholesterol, Total: 115 mg/dL (ref 100–199)
HDL: 61 mg/dL (ref >39)
LDL Chol Calc (NIH): 39 mg/dL (ref 0–99)
Triglycerides: 72 mg/dL (ref 0–149)
VLDL Cholesterol Cal: 15 mg/dL (ref 5–40)

## 2023-09-25 ENCOUNTER — Ambulatory Visit (INDEPENDENT_AMBULATORY_CARE_PROVIDER_SITE_OTHER): Admitting: Internal Medicine

## 2023-09-25 ENCOUNTER — Encounter: Payer: Self-pay | Admitting: Internal Medicine

## 2023-09-25 VITALS — BP 131/79 | HR 42 | Temp 97.5°F | Ht 70.0 in | Wt 173.0 lb

## 2023-09-25 DIAGNOSIS — E782 Mixed hyperlipidemia: Secondary | ICD-10-CM

## 2023-09-25 DIAGNOSIS — I10 Essential (primary) hypertension: Secondary | ICD-10-CM

## 2023-09-25 DIAGNOSIS — R001 Bradycardia, unspecified: Secondary | ICD-10-CM

## 2023-09-25 NOTE — Progress Notes (Signed)
 Established Patient Office Visit  Subjective:  Patient ID: Paul Gaines, male    DOB: 1932/09/28  Age: 89 y.o. MRN: [id number]  Chief Complaint  Patient presents with   Follow-up    1 week follow up     No new complaints, here for lab review and medication refills. Labs reviewed and notable for elevated Cr while LDL remains at target.    No other concerns at this time.   Past Medical History:  Diagnosis Date   Arthritis    Asthma    BCC (basal cell carcinoma) 09/15/2022   superficial at left upper back, EDC 11/30/22   Cancer (HCC)    hx of skin cancer on nose   Chronic kidney disease    frequency   Coronary artery disease    stent - 2001    Dysplastic nevus 08/13/2014   left post distal deltoid   Hypertension    Legally blind    Myocardial infarction (HCC)    Pneumonia    hx of several times per pt    Shortness of breath    with exertion    Squamous cell carcinoma in situ (SCCIS) 05/24/2022   left temple, Mohs 07/20/2022   Squamous cell carcinoma of skin 07/01/2010   left infraorbital (in situ)    Past Surgical History:  Procedure Laterality Date   CORONARY ANGIOPLASTY     1988, stents    EYE SURGERY     right eye surgery , macular transrelocation    HERNIA REPAIR     KNEE ARTHROSCOPY     left    TONSILLECTOMY     TOTAL KNEE ARTHROPLASTY  03/26/2012   Procedure: TOTAL KNEE ARTHROPLASTY;  Surgeon: Loanne Drilling, MD;  Location: [facility];  Service: Orthopedics;  Laterality: Left;    Social History   Socioeconomic History   Marital status: Married    Spouse name: Not on file   Number of children: Not on file   Years of education: Not on file   Highest education level: Not on file  Occupational History   Not on file  Tobacco Use   Smoking status: Former    Current packs/day: 0.00    Types: Cigarettes    Quit date: 07/18/1976    Years since quitting: 47.2   Smokeless tobacco: Never  Substance and Sexual Activity   Alcohol use: Never    Comment:  infrequent per pt    Drug use: No   Sexual activity: Not on file  Other Topics Concern   Not on file  Social History Narrative   Not on file   Social Drivers of Health   Financial Resource Strain: Not on file  Food Insecurity: Not on file  Transportation Needs: Not on file  Physical Activity: Not on file  Stress: Not on file  Social Connections: Not on file  Intimate Partner Violence: Not on file    No family history on file.  Allergies  Allergen Reactions   Celecoxib     Reaction=heart attack per patient   Lisinopril     Outpatient Medications Prior to Visit  Medication Sig   Accu-Chek Softclix Lancets lancets TEST TWO TIMES DAILY   aspirin EC 81 MG tablet Take 81 mg by mouth daily.   carvedilol (COREG) 12.5 MG tablet TAKE 1 TABLET TWICE DAILY   dorzolamide-timolol (COSOPT) 2-0.5 % ophthalmic solution Place 1 drop into both eyes 2 (two) times daily.   fluticasone (FLONASE) 50 MCG/ACT nasal spray USE 1  SPRAY IN EACH NOSTRIL IN THE MORNING AS DIRECTED   iron polysaccharides (NIFEREX) 150 MG capsule Take 1 capsule (150 mg total) by mouth 2 (two) times daily.   losartan (COZAAR) 100 MG tablet Take 1 tablet (100 mg total) by mouth daily.   pravastatin (PRAVACHOL) 40 MG tablet TAKE 1 TABLET EVERY DAY   prednisoLONE acetate (PRED FORTE) 1 % ophthalmic suspension Place 1 drop into the right eye every morning.   Psyllium (METAMUCIL PO) Take 1 tablet by mouth every morning.   tamsulosin (FLOMAX) 0.4 MG CAPS capsule Take 1 capsule (0.4 mg total) by mouth every morning.   timolol (BETIMOL) 0.5 % ophthalmic solution Place 1 drop into both eyes every morning.   trospium (SANCTURA) 20 MG tablet Take 20 mg by mouth at bedtime.   No facility-administered medications prior to visit.    Review of Systems  Constitutional:  Negative for malaise/fatigue.  HENT: Negative.    Eyes: Negative.   Respiratory:  Negative for cough.   Cardiovascular:  Negative for chest pain and palpitations.   Gastrointestinal: Negative.   Neurological:  Negative for dizziness.  Psychiatric/Behavioral: Negative.    All other systems reviewed and are negative.      Objective:   BP 131/79   Pulse (!) 42   Temp (!) 97.5 F (36.4 C)   Ht 5\' 10"  (1.778 m)   Wt 173 lb (78.5 kg)   SpO2 98%   BMI 24.82 kg/m   Vitals:   09/25/23 1457  BP: 131/79  Pulse: (!) 42  Temp: (!) 97.5 F (36.4 C)  Height: 5\' 10"  (1.778 m)  Weight: 173 lb (78.5 kg)  SpO2: 98%  BMI (Calculated): 24.82    Physical Exam Vitals reviewed.  Constitutional:      Appearance: Normal appearance.  HENT:     Head: Normocephalic.     Left Ear: There is no impacted cerumen.     Nose: Nose normal.     Mouth/Throat:     Mouth: Mucous membranes are moist.     Pharynx: No posterior oropharyngeal erythema.  Eyes:     Comments: Poor visual acuity bilaterally  Cardiovascular:     Rate and Rhythm: Regular rhythm.     Chest Wall: PMI is not displaced.     Pulses: Normal pulses.     Heart sounds: Normal heart sounds. No murmur heard. Pulmonary:     Effort: Pulmonary effort is normal.     Breath sounds: Normal air entry. No rhonchi or rales.  Abdominal:     General: Abdomen is flat. Bowel sounds are normal. There is no distension.     Palpations: Abdomen is soft. There is no hepatomegaly, splenomegaly or mass.     Tenderness: There is no abdominal tenderness.  Musculoskeletal:        General: Normal range of motion.     Cervical back: Normal range of motion and neck supple.     Right lower leg: No edema.     Left lower leg: No edema.  Skin:    General: Skin is warm and dry.     Comments: Wart like lesion on right groin.  Neurological:     General: No focal deficit present.     Mental Status: He is alert and oriented to person, place, and time.     Cranial Nerves: No cranial nerve deficit.     Motor: No weakness.  Psychiatric:        Mood and Affect: Mood normal.  Behavior: Behavior normal.      No  results found for any visits on 09/25/23.  Recent Results (from the past 2160 hours)  Comprehensive metabolic panel     Status: Abnormal   Collection Time: 09/19/23  8:36 AM  Result Value Ref Range   Glucose 90 70 - 99 mg/dL   BUN 22 8 - 27 mg/dL   Creatinine, Ser 4.09 (H) 0.76 - 1.27 mg/dL   eGFR 51 (L) >81 XB/JYN/8.29   BUN/Creatinine Ratio 17 10 - 24   Sodium 144 134 - 144 mmol/L   Potassium 4.6 3.5 - 5.2 mmol/L   Chloride 108 (H) 96 - 106 mmol/L   CO2 24 20 - 29 mmol/L   Calcium 9.1 8.6 - 10.2 mg/dL   Total Protein 6.0 6.0 - 8.5 g/dL   Albumin 4.0 3.7 - 4.7 g/dL   Globulin, Total 2.0 1.5 - 4.5 g/dL   Bilirubin Total 0.4 0.0 - 1.2 mg/dL   Alkaline Phosphatase 61 44 - 121 IU/L   AST 9 0 - 40 IU/L   ALT 8 0 - 44 IU/L  Lipid panel     Status: None   Collection Time: 09/19/23  8:36 AM  Result Value Ref Range   Cholesterol, Total 115 100 - 199 mg/dL   Triglycerides 72 0 - 149 mg/dL   HDL 61 >56 mg/dL   VLDL Cholesterol Cal 15 5 - 40 mg/dL   LDL Chol Calc (NIH) 39 0 - 99 mg/dL   Chol/HDL Ratio 1.9 0.0 - 5.0 ratio    Comment:                                   T. Chol/HDL Ratio                                             Men  Women                               1/2 Avg.Risk  3.4    3.3                                   Avg.Risk  5.0    4.4                                2X Avg.Risk  9.6    7.1                                3X Avg.Risk 23.4   11.0       Assessment & Plan:  As per problem list  Problem List Items Addressed This Visit       Cardiovascular and Mediastinum   Primary hypertension - Primary     Other   Mixed hyperlipidemia   Relevant Orders   Lipid panel   Comprehensive metabolic panel   Other Visit Diagnoses       Sinus bradycardia           Return in about 3 months (around 12/26/2023) for fu with labs  prior.   Total time spent: 20 minutes  Luna Fuse, MD  09/25/2023   This document may have been prepared by [facility] Voice  Recognition software and as such may include unintentional dictation errors.

## 2023-09-28 ENCOUNTER — Encounter: Payer: Self-pay | Admitting: Cardiovascular Disease

## 2023-09-28 ENCOUNTER — Ambulatory Visit: Payer: Medicare HMO | Admitting: Cardiovascular Disease

## 2023-09-28 VITALS — BP 169/98 | HR 55 | Ht 70.0 in | Wt 173.0 lb

## 2023-09-28 DIAGNOSIS — R001 Bradycardia, unspecified: Secondary | ICD-10-CM

## 2023-09-28 DIAGNOSIS — I10 Essential (primary) hypertension: Secondary | ICD-10-CM

## 2023-09-28 DIAGNOSIS — I2510 Atherosclerotic heart disease of native coronary artery without angina pectoris: Secondary | ICD-10-CM | POA: Diagnosis not present

## 2023-09-28 DIAGNOSIS — E119 Type 2 diabetes mellitus without complications: Secondary | ICD-10-CM

## 2023-09-28 DIAGNOSIS — N179 Acute kidney failure, unspecified: Secondary | ICD-10-CM

## 2023-09-28 DIAGNOSIS — R0602 Shortness of breath: Secondary | ICD-10-CM

## 2023-09-28 DIAGNOSIS — E782 Mixed hyperlipidemia: Secondary | ICD-10-CM

## 2023-09-28 MED ORDER — LOSARTAN POTASSIUM 100 MG PO TABS: 100.0000 mg | ORAL_TABLET | Freq: Every day | ORAL | 11 refills | Status: DC

## 2023-09-28 MED ORDER — AMLODIPINE BESYLATE 10 MG PO TABS: 10.0000 mg | ORAL_TABLET | Freq: Every day | ORAL | 11 refills | Status: AC

## 2023-09-28 NOTE — Addendum Note (Signed)
 Addended by: Adrian Blackwater A on: 09/28/2023 01:42 PM   Modules accepted: Orders

## 2023-09-28 NOTE — Progress Notes (Addendum)
 Cardiology Office Note   Date:  09/28/2023   ID:  [name], [name] Aug 01, 1932, MRN [id number]  PCP:  Sherron Monday, MD  Cardiologist:  Adrian Blackwater, MD      History of Present Illness: Paul Gaines is a 89 y.o. male who presents for No chief complaint on file.   Was watching basketball and now BP is up, took morning but not noon meds.      Past Medical History:  Diagnosis Date   Arthritis    Asthma    BCC (basal cell carcinoma) 09/15/2022   superficial at left upper back, EDC 11/30/22   Cancer (HCC)    hx of skin cancer on nose   Chronic kidney disease    frequency   Coronary artery disease    stent - 2001    Dysplastic nevus 08/13/2014   left post distal deltoid   Hypertension    Legally blind    Myocardial infarction (HCC)    Pneumonia    hx of several times per pt    Shortness of breath    with exertion    Squamous cell carcinoma in situ (SCCIS) 05/24/2022   left temple, Mohs 07/20/2022   Squamous cell carcinoma of skin 07/01/2010   left infraorbital (in situ)     Past Surgical History:  Procedure Laterality Date   CORONARY ANGIOPLASTY     1988, stents    EYE SURGERY     right eye surgery , macular transrelocation    HERNIA REPAIR     KNEE ARTHROSCOPY     left    TONSILLECTOMY     TOTAL KNEE ARTHROPLASTY  03/26/2012   Procedure: TOTAL KNEE ARTHROPLASTY;  Surgeon: Loanne Drilling, MD;  Location: [facility];  Service: Orthopedics;  Laterality: Left;     Current Outpatient Medications  Medication Sig Dispense Refill   amLODipine (NORVASC) 10 MG tablet Take 1 tablet (10 mg total) by mouth daily. 30 tablet 11   amLODipine (NORVASC) 10 MG tablet Take 1 tablet (10 mg total) by mouth daily. 30 tablet 11   Accu-Chek Softclix Lancets lancets TEST TWO TIMES DAILY 200 each 3   aspirin EC 81 MG tablet Take 81 mg by mouth daily.     carvedilol (COREG) 12.5 MG tablet TAKE 1 TABLET TWICE DAILY 180 tablet 3   dorzolamide-timolol (COSOPT) 2-0.5 %  ophthalmic solution Place 1 drop into both eyes 2 (two) times daily.     fluticasone (FLONASE) 50 MCG/ACT nasal spray USE 1 SPRAY IN EACH NOSTRIL IN THE MORNING AS DIRECTED 32 g 3   iron polysaccharides (NIFEREX) 150 MG capsule Take 1 capsule (150 mg total) by mouth 2 (two) times daily. 42 capsule 0   losartan (COZAAR) 100 MG tablet Take 1 tablet (100 mg total) by mouth daily. 30 tablet 11   pravastatin (PRAVACHOL) 40 MG tablet TAKE 1 TABLET EVERY DAY 90 tablet 3   prednisoLONE acetate (PRED FORTE) 1 % ophthalmic suspension Place 1 drop into the right eye every morning.     Psyllium (METAMUCIL PO) Take 1 tablet by mouth every morning.     tamsulosin (FLOMAX) 0.4 MG CAPS capsule Take 1 capsule (0.4 mg total) by mouth every morning. 90 capsule 3   timolol (BETIMOL) 0.5 % ophthalmic solution Place 1 drop into both eyes every morning.     trospium (SANCTURA) 20 MG tablet Take 20 mg by mouth at bedtime.     No current facility-administered medications for  this visit.    Allergies:   Celecoxib and Lisinopril    Social History:   reports that he quit smoking about 47 years ago. His smoking use included cigarettes. He has never used smokeless tobacco. He reports that he does not drink alcohol and does not use drugs.   Family History:  family history is not on file.    ROS:     Review of Systems  Constitutional: Negative.   HENT: Negative.    Eyes: Negative.   Respiratory: Negative.    Gastrointestinal: Negative.   Genitourinary: Negative.   Musculoskeletal: Negative.   Skin: Negative.   Neurological: Negative.   Endo/Heme/Allergies: Negative.   Psychiatric/Behavioral: Negative.    All other systems reviewed and are negative.     All other systems are reviewed and negative.    PHYSICAL EXAM: VS:  BP (!) 169/98   Pulse (!) 55   Ht 5\' 10"  (1.778 m)   Wt 173 lb (78.5 kg)   SpO2 99%   BMI 24.82 kg/m  , BMI Body mass index is 24.82 kg/m. Last weight:  Wt Readings from Last 3  Encounters:  09/28/23 173 lb (78.5 kg)  09/25/23 173 lb (78.5 kg)  09/18/23 172 lb (78 kg)     Physical Exam Vitals reviewed.  Constitutional:      Appearance: Normal appearance. He is normal weight.  HENT:     Head: Normocephalic.     Nose: Nose normal.     Mouth/Throat:     Mouth: Mucous membranes are moist.  Eyes:     Pupils: Pupils are equal, round, and reactive to light.  Cardiovascular:     Rate and Rhythm: Normal rate and regular rhythm.     Pulses: Normal pulses.     Heart sounds: Normal heart sounds.  Pulmonary:     Effort: Pulmonary effort is normal.  Abdominal:     General: Abdomen is flat. Bowel sounds are normal.  Musculoskeletal:        General: Normal range of motion.     Cervical back: Normal range of motion.  Skin:    General: Skin is warm.  Neurological:     General: No focal deficit present.     Mental Status: He is alert.  Psychiatric:        Mood and Affect: Mood normal.       EKG:   Recent Labs: 09/19/2023: ALT 8; BUN 22; Creatinine, Ser 1.33; Potassium 4.6; Sodium 144    Lipid Panel    Component Value Date/Time   CHOL 115 09/19/2023 0836   TRIG 72 09/19/2023 0836   HDL 61 09/19/2023 0836   CHOLHDL 1.9 09/19/2023 0836   LDLCALC 39 09/19/2023 0836      Other studies Reviewed: Additional studies/ records that were reviewed today include:  Review of the above records demonstrates:       No data to display            ASSESSMENT AND PLAN:    ICD-10-CM   1. Primary hypertension  I10 amLODipine (NORVASC) 10 MG tablet    losartan (COZAAR) 100 MG tablet    PCV ECHOCARDIOGRAM COMPLETE    amLODipine (NORVASC) 10 MG tablet   add amlodapine 10 mg as is too high    2. Mixed hyperlipidemia  E78.2 amLODipine (NORVASC) 10 MG tablet    losartan (COZAAR) 100 MG tablet    PCV ECHOCARDIOGRAM COMPLETE    amLODipine (NORVASC) 10 MG tablet    3.  Sinus bradycardia  R00.1 amLODipine (NORVASC) 10 MG tablet    losartan (COZAAR) 100 MG  tablet    PCV ECHOCARDIOGRAM COMPLETE    amLODipine (NORVASC) 10 MG tablet    4. Coronary artery disease involving native coronary artery of native heart without angina pectoris  I25.10 amLODipine (NORVASC) 10 MG tablet    losartan (COZAAR) 100 MG tablet    PCV ECHOCARDIOGRAM COMPLETE    amLODipine (NORVASC) 10 MG tablet    5. SOB (shortness of breath)  R06.02 amLODipine (NORVASC) 10 MG tablet    losartan (COZAAR) 100 MG tablet    PCV ECHOCARDIOGRAM COMPLETE    amLODipine (NORVASC) 10 MG tablet    6. Acute kidney injury (nontraumatic) (HCC)  N17.9 amLODipine (NORVASC) 10 MG tablet    losartan (COZAAR) 100 MG tablet    PCV ECHOCARDIOGRAM COMPLETE    amLODipine (NORVASC) 10 MG tablet    7. Diabetes mellitus without complication (HCC)  E11.9 amLODipine (NORVASC) 10 MG tablet    losartan (COZAAR) 100 MG tablet    PCV ECHOCARDIOGRAM COMPLETE    amLODipine (NORVASC) 10 MG tablet    8. Primary hypertension  I10 amLODipine (NORVASC) 10 MG tablet    losartan (COZAAR) 100 MG tablet    PCV ECHOCARDIOGRAM COMPLETE    amLODipine (NORVASC) 10 MG tablet   Repeat BP 150/80, change losartan 100 mg daily       Problem List Items Addressed This Visit       Cardiovascular and Mediastinum   Primary hypertension - Primary   Relevant Medications   amLODipine (NORVASC) 10 MG tablet   losartan (COZAAR) 100 MG tablet   amLODipine (NORVASC) 10 MG tablet   Other Relevant Orders   PCV ECHOCARDIOGRAM COMPLETE     Other   Mixed hyperlipidemia   Relevant Medications   amLODipine (NORVASC) 10 MG tablet   losartan (COZAAR) 100 MG tablet   amLODipine (NORVASC) 10 MG tablet   Other Relevant Orders   PCV ECHOCARDIOGRAM COMPLETE   Other Visit Diagnoses       Sinus bradycardia       Relevant Medications   amLODipine (NORVASC) 10 MG tablet   losartan (COZAAR) 100 MG tablet   amLODipine (NORVASC) 10 MG tablet   Other Relevant Orders   PCV ECHOCARDIOGRAM COMPLETE     Coronary artery disease  involving native coronary artery of native heart without angina pectoris       Relevant Medications   amLODipine (NORVASC) 10 MG tablet   losartan (COZAAR) 100 MG tablet   amLODipine (NORVASC) 10 MG tablet   Other Relevant Orders   PCV ECHOCARDIOGRAM COMPLETE     SOB (shortness of breath)       Relevant Medications   amLODipine (NORVASC) 10 MG tablet   losartan (COZAAR) 100 MG tablet   amLODipine (NORVASC) 10 MG tablet   Other Relevant Orders   PCV ECHOCARDIOGRAM COMPLETE     Acute kidney injury (nontraumatic) (HCC)       Relevant Medications   amLODipine (NORVASC) 10 MG tablet   losartan (COZAAR) 100 MG tablet   amLODipine (NORVASC) 10 MG tablet   Other Relevant Orders   PCV ECHOCARDIOGRAM COMPLETE     Diabetes mellitus without complication (HCC)       Relevant Medications   amLODipine (NORVASC) 10 MG tablet   losartan (COZAAR) 100 MG tablet   amLODipine (NORVASC) 10 MG tablet   Other Relevant Orders   PCV ECHOCARDIOGRAM COMPLETE  Disposition:   Return for echo and f/u.    Total time spent: 30 minutes  Signed,  Adrian Blackwater, MD  09/28/2023 1:42 PM    [facility]

## 2023-10-09 ENCOUNTER — Other Ambulatory Visit: Payer: Self-pay | Admitting: Internal Medicine

## 2023-10-09 ENCOUNTER — Ambulatory Visit: Payer: Medicare HMO | Admitting: Internal Medicine

## 2023-10-16 ENCOUNTER — Other Ambulatory Visit: Payer: Self-pay | Admitting: Cardiovascular Disease

## 2023-10-20 ENCOUNTER — Ambulatory Visit

## 2023-10-20 DIAGNOSIS — E782 Mixed hyperlipidemia: Secondary | ICD-10-CM

## 2023-10-20 DIAGNOSIS — I10 Essential (primary) hypertension: Secondary | ICD-10-CM

## 2023-10-20 DIAGNOSIS — I2510 Atherosclerotic heart disease of native coronary artery without angina pectoris: Secondary | ICD-10-CM

## 2023-10-20 DIAGNOSIS — I340 Nonrheumatic mitral (valve) insufficiency: Secondary | ICD-10-CM | POA: Diagnosis not present

## 2023-10-20 DIAGNOSIS — I371 Nonrheumatic pulmonary valve insufficiency: Secondary | ICD-10-CM

## 2023-10-20 DIAGNOSIS — I361 Nonrheumatic tricuspid (valve) insufficiency: Secondary | ICD-10-CM | POA: Diagnosis not present

## 2023-10-20 DIAGNOSIS — N179 Acute kidney failure, unspecified: Secondary | ICD-10-CM

## 2023-10-20 DIAGNOSIS — I351 Nonrheumatic aortic (valve) insufficiency: Secondary | ICD-10-CM

## 2023-10-20 DIAGNOSIS — E119 Type 2 diabetes mellitus without complications: Secondary | ICD-10-CM

## 2023-10-20 DIAGNOSIS — R0602 Shortness of breath: Secondary | ICD-10-CM

## 2023-10-20 DIAGNOSIS — R001 Bradycardia, unspecified: Secondary | ICD-10-CM

## 2023-10-26 ENCOUNTER — Ambulatory Visit: Admitting: Cardiovascular Disease

## 2023-10-26 ENCOUNTER — Encounter: Payer: Self-pay | Admitting: Cardiovascular Disease

## 2023-10-26 ENCOUNTER — Telehealth: Payer: Self-pay

## 2023-10-26 VITALS — BP 162/78 | HR 45 | Ht 70.0 in | Wt 175.0 lb

## 2023-10-26 DIAGNOSIS — I10 Essential (primary) hypertension: Secondary | ICD-10-CM

## 2023-10-26 DIAGNOSIS — R001 Bradycardia, unspecified: Secondary | ICD-10-CM | POA: Diagnosis not present

## 2023-10-26 DIAGNOSIS — R0602 Shortness of breath: Secondary | ICD-10-CM

## 2023-10-26 DIAGNOSIS — I2510 Atherosclerotic heart disease of native coronary artery without angina pectoris: Secondary | ICD-10-CM | POA: Diagnosis not present

## 2023-10-26 DIAGNOSIS — E782 Mixed hyperlipidemia: Secondary | ICD-10-CM

## 2023-10-26 MED ORDER — SPIRONOLACTONE 25 MG PO TABS: 12.5000 mg | ORAL_TABLET | Freq: Every day | ORAL | 11 refills | Status: AC

## 2023-10-26 MED ORDER — CARVEDILOL 6.25 MG PO TABS: 6.2500 mg | ORAL_TABLET | Freq: Two times a day (BID) | ORAL | 11 refills | Status: DC

## 2023-10-26 NOTE — Telephone Encounter (Signed)
 Patient called asking since he has to cut his carvedilol 12.5mg   in half until he finishes it, can he just take one a day or does he need to cut it and take it twice a day?

## 2023-10-26 NOTE — Progress Notes (Signed)
 Cardiology Office Note   Date:  10/26/2023   ID:  Gaines, Paul Aug 06, 1932, MRN [id number]  PCP:  Sherron Monday, MD  Cardiologist:  Adrian Blackwater, MD      History of Present Illness: Paul Gaines is a 89 y.o. male who presents for  Chief Complaint  Patient presents with   Follow-up    Echo results    Has headaches as bP is elevated, and HR low      Past Medical History:  Diagnosis Date   Arthritis    Asthma    BCC (basal cell carcinoma) 09/15/2022   superficial at left upper back, [facility] 11/30/22   Cancer (HCC)    hx of skin cancer on nose   Chronic kidney disease    frequency   Coronary artery disease    stent - 2001    Dysplastic nevus 08/13/2014   left post distal deltoid   Hypertension    Legally blind    Myocardial infarction (HCC)    Pneumonia    hx of several times per pt    Shortness of breath    with exertion    Squamous cell carcinoma in situ (SCCIS) 05/24/2022   left temple, Mohs 07/20/2022   Squamous cell carcinoma of skin 07/01/2010   left infraorbital (in situ)     Past Surgical History:  Procedure Laterality Date   CORONARY ANGIOPLASTY     1988, stents    EYE SURGERY     right eye surgery , macular transrelocation    HERNIA REPAIR     KNEE ARTHROSCOPY     left    TONSILLECTOMY     TOTAL KNEE ARTHROPLASTY  03/26/2012   Procedure: TOTAL KNEE ARTHROPLASTY;  Surgeon: Loanne Drilling, MD;  Location: [facility];  Service: Orthopedics;  Laterality: Left;     Current Outpatient Medications  Medication Sig Dispense Refill   carvedilol (COREG) 6.25 MG tablet Take 1 tablet (6.25 mg total) by mouth 2 (two) times daily. 60 tablet 11   spironolactone (ALDACTONE) 25 MG tablet Take 0.5 tablets (12.5 mg total) by mouth daily. 30 tablet 11   Accu-Chek Softclix Lancets lancets TEST TWO TIMES DAILY 200 each 3   amLODipine (NORVASC) 10 MG tablet Take 1 tablet (10 mg total) by mouth daily. 30 tablet 11   amLODipine (NORVASC) 10 MG tablet Take 1  tablet (10 mg total) by mouth daily. 30 tablet 11   aspirin EC 81 MG tablet Take 81 mg by mouth daily.     carvedilol (COREG) 12.5 MG tablet TAKE 1 TABLET TWICE DAILY 180 tablet 3   dorzolamide-timolol (COSOPT) 2-0.5 % ophthalmic solution Place 1 drop into both eyes 2 (two) times daily.     fluticasone (FLONASE) 50 MCG/ACT nasal spray USE 1 SPRAY IN EACH NOSTRIL IN THE MORNING AS DIRECTED 32 g 3   iron polysaccharides (NIFEREX) 150 MG capsule Take 1 capsule (150 mg total) by mouth 2 (two) times daily. 42 capsule 0   losartan (COZAAR) 100 MG tablet Take 1 tablet (100 mg total) by mouth daily. 30 tablet 11   pravastatin (PRAVACHOL) 40 MG tablet TAKE 1 TABLET EVERY DAY 90 tablet 3   prednisoLONE acetate (PRED FORTE) 1 % ophthalmic suspension Place 1 drop into the right eye every morning.     Psyllium (METAMUCIL PO) Take 1 tablet by mouth every morning.     tamsulosin (FLOMAX) 0.4 MG CAPS capsule Take 1 capsule (0.4 mg total) by  mouth every morning. 90 capsule 3   timolol (BETIMOL) 0.5 % ophthalmic solution Place 1 drop into both eyes every morning.     trospium (SANCTURA) 20 MG tablet Take 20 mg by mouth at bedtime.     No current facility-administered medications for this visit.    Allergies:   Celecoxib and Lisinopril    Social History:   reports that he quit smoking about 47 years ago. His smoking use included cigarettes. He has never used smokeless tobacco. He reports that he does not drink alcohol and does not use drugs.   Family History:  family history is not on file.    ROS:     Review of Systems  Constitutional: Negative.   HENT: Negative.    Eyes: Negative.   Respiratory: Negative.    Gastrointestinal: Negative.   Genitourinary: Negative.   Musculoskeletal: Negative.   Skin: Negative.   Neurological: Negative.   Endo/Heme/Allergies: Negative.   Psychiatric/Behavioral: Negative.    All other systems reviewed and are negative.     All other systems are reviewed and  negative.    PHYSICAL EXAM: VS:  BP (!) 162/78   Pulse (!) 45   Ht 5\' 10"  (1.778 m)   Wt 175 lb (79.4 kg)   SpO2 96%   BMI 25.11 kg/m  , BMI Body mass index is 25.11 kg/m. Last weight:  Wt Readings from Last 3 Encounters:  10/26/23 175 lb (79.4 kg)  09/28/23 173 lb (78.5 kg)  09/25/23 173 lb (78.5 kg)     Physical Exam Vitals reviewed.  Constitutional:      Appearance: Normal appearance. He is normal weight.  HENT:     Head: Normocephalic.     Nose: Nose normal.     Mouth/Throat:     Mouth: Mucous membranes are moist.  Eyes:     Pupils: Pupils are equal, round, and reactive to light.  Cardiovascular:     Rate and Rhythm: Normal rate and regular rhythm.     Pulses: Normal pulses.     Heart sounds: Normal heart sounds.  Pulmonary:     Effort: Pulmonary effort is normal.  Abdominal:     General: Abdomen is flat. Bowel sounds are normal.  Musculoskeletal:        General: Normal range of motion.     Cervical back: Normal range of motion.  Skin:    General: Skin is warm.  Neurological:     General: No focal deficit present.     Mental Status: He is alert.  Psychiatric:        Mood and Affect: Mood normal.       EKG:   Recent Labs: 09/19/2023: ALT 8; BUN 22; Creatinine, Ser 1.33; Potassium 4.6; Sodium 144    Lipid Panel    Component Value Date/Time   CHOL 115 09/19/2023 0836   TRIG 72 09/19/2023 0836   HDL 61 09/19/2023 0836   CHOLHDL 1.9 09/19/2023 0836   LDLCALC 39 09/19/2023 0836      Other studies Reviewed: Additional studies/ records that were reviewed today include:  Review of the above records demonstrates:       No data to display            ASSESSMENT AND PLAN:    ICD-10-CM   1. SOB (shortness of breath)  R06.02 carvedilol (COREG) 6.25 MG tablet    spironolactone (ALDACTONE) 25 MG tablet    2. Primary hypertension  I10 carvedilol (COREG) 6.25 MG tablet  spironolactone (ALDACTONE) 25 MG tablet   Bp still elvated add  medications.. Add aldactone 12.5 qd as has diastolic dysfunction but normal LVEF on echo    3. Coronary artery disease involving native coronary artery of native heart without angina pectoris  I25.10 carvedilol (COREG) 6.25 MG tablet    spironolactone (ALDACTONE) 25 MG tablet    4. Sinus bradycardia  R00.1 carvedilol (COREG) 6.25 MG tablet    spironolactone (ALDACTONE) 25 MG tablet   HR 44, decrease coreg 6.25 bid    5. Mixed hyperlipidemia  E78.2 carvedilol (COREG) 6.25 MG tablet    spironolactone (ALDACTONE) 25 MG tablet       Problem List Items Addressed This Visit       Cardiovascular and Mediastinum   Primary hypertension   Relevant Medications   carvedilol (COREG) 6.25 MG tablet   spironolactone (ALDACTONE) 25 MG tablet     Other   Mixed hyperlipidemia   Relevant Medications   carvedilol (COREG) 6.25 MG tablet   spironolactone (ALDACTONE) 25 MG tablet   Other Visit Diagnoses       SOB (shortness of breath)    -  Primary   Relevant Medications   carvedilol (COREG) 6.25 MG tablet   spironolactone (ALDACTONE) 25 MG tablet     Coronary artery disease involving native coronary artery of native heart without angina pectoris       Relevant Medications   carvedilol (COREG) 6.25 MG tablet   spironolactone (ALDACTONE) 25 MG tablet     Sinus bradycardia       HR 44, decrease coreg 6.25 bid   Relevant Medications   carvedilol (COREG) 6.25 MG tablet   spironolactone (ALDACTONE) 25 MG tablet          Disposition:   Return in about 4 weeks (around 11/23/2023).    Total time spent: 30 minutes  Signed,  Adrian Blackwater, MD  10/26/2023 1:40 PM    [facility]

## 2023-10-27 NOTE — Telephone Encounter (Signed)
 Patient informed he needs to cut them in half

## 2023-11-20 ENCOUNTER — Ambulatory Visit: Admitting: Cardiovascular Disease

## 2023-11-20 ENCOUNTER — Encounter: Payer: Self-pay | Admitting: Cardiovascular Disease

## 2023-11-20 VITALS — BP 120/80 | HR 58 | Ht 70.0 in | Wt 173.6 lb

## 2023-11-20 DIAGNOSIS — I10 Essential (primary) hypertension: Secondary | ICD-10-CM | POA: Diagnosis not present

## 2023-11-20 DIAGNOSIS — E782 Mixed hyperlipidemia: Secondary | ICD-10-CM

## 2023-11-20 DIAGNOSIS — R001 Bradycardia, unspecified: Secondary | ICD-10-CM

## 2023-11-20 DIAGNOSIS — I2510 Atherosclerotic heart disease of native coronary artery without angina pectoris: Secondary | ICD-10-CM | POA: Diagnosis not present

## 2023-11-20 DIAGNOSIS — R0602 Shortness of breath: Secondary | ICD-10-CM

## 2023-11-20 NOTE — Progress Notes (Signed)
 Cardiology Office Note   Date:  11/20/2023   ID:  Paul Gaines, DOB July 23, 1932, MRN [id number]  PCP:  Shari Daughters, MD  Cardiologist:  Debborah Fairly, MD      History of Present Illness: Paul Gaines is a 89 y.o. male who presents for  Chief Complaint  Patient presents with   Follow-up    4 week follow up    Doing well      Past Medical History:  Diagnosis Date   Arthritis    Asthma    BCC (basal cell carcinoma) 09/15/2022   superficial at left upper back, [facility] 11/30/22   Cancer (HCC)    hx of skin cancer on nose   Chronic kidney disease    frequency   Coronary artery disease    stent - 2001    Dysplastic nevus 08/13/2014   left post distal deltoid   Hypertension    Legally blind    Myocardial infarction (HCC)    Pneumonia    hx of several times per pt    Shortness of breath    with exertion    Squamous cell carcinoma in situ (SCCIS) 05/24/2022   left temple, Mohs 07/20/2022   Squamous cell carcinoma of skin 07/01/2010   left infraorbital (in situ)     Past Surgical History:  Procedure Laterality Date   CORONARY ANGIOPLASTY     1988, stents    EYE SURGERY     right eye surgery , macular transrelocation    HERNIA REPAIR     KNEE ARTHROSCOPY     left    TONSILLECTOMY     TOTAL KNEE ARTHROPLASTY  03/26/2012   Procedure: TOTAL KNEE ARTHROPLASTY;  Surgeon: Aurther Blue, MD;  Location: [facility];  Service: Orthopedics;  Laterality: Left;     Current Outpatient Medications  Medication Sig Dispense Refill   Accu-Chek Softclix Lancets lancets TEST TWO TIMES DAILY 200 each 3   amLODipine  (NORVASC ) 10 MG tablet Take 1 tablet (10 mg total) by mouth daily. 30 tablet 11   amLODipine  (NORVASC ) 10 MG tablet Take 1 tablet (10 mg total) by mouth daily. 30 tablet 11   aspirin EC 81 MG tablet Take 81 mg by mouth daily.     carvedilol  (COREG ) 12.5 MG tablet TAKE 1 TABLET TWICE DAILY 180 tablet 3   carvedilol  (COREG ) 6.25 MG tablet Take 1 tablet (6.25 mg  total) by mouth 2 (two) times daily. 60 tablet 11   dorzolamide-timolol  (COSOPT) 2-0.5 % ophthalmic solution Place 1 drop into both eyes 2 (two) times daily.     fluticasone (FLONASE) 50 MCG/ACT nasal spray USE 1 SPRAY IN EACH NOSTRIL IN THE MORNING AS DIRECTED 32 g 3   losartan  (COZAAR ) 100 MG tablet Take 1 tablet (100 mg total) by mouth daily. 30 tablet 11   pravastatin (PRAVACHOL) 40 MG tablet TAKE 1 TABLET EVERY DAY 90 tablet 3   prednisoLONE  acetate (PRED FORTE ) 1 % ophthalmic suspension Place 1 drop into the right eye every morning.     Psyllium (METAMUCIL PO) Take 1 tablet by mouth every morning.     spironolactone  (ALDACTONE ) 25 MG tablet Take 0.5 tablets (12.5 mg total) by mouth daily. 30 tablet 11   tamsulosin  (FLOMAX ) 0.4 MG CAPS capsule Take 1 capsule (0.4 mg total) by mouth every morning. 90 capsule 3   timolol  (BETIMOL ) 0.5 % ophthalmic solution Place 1 drop into both eyes every morning.     trospium (SANCTURA)  20 MG tablet Take 20 mg by mouth at bedtime.     iron  polysaccharides (NIFEREX) 150 MG capsule Take 1 capsule (150 mg total) by mouth 2 (two) times daily. 42 capsule 0   No current facility-administered medications for this visit.    Allergies:   Celecoxib and Lisinopril    Social History:   reports that he quit smoking about 47 years ago. His smoking use included cigarettes. He has never used smokeless tobacco. He reports that he does not drink alcohol and does not use drugs.   Family History:  family history is not on file.    ROS:     Review of Systems  Constitutional: Negative.   HENT: Negative.    Eyes: Negative.   Respiratory: Negative.    Gastrointestinal: Negative.   Genitourinary: Negative.   Musculoskeletal: Negative.   Skin: Negative.   Neurological: Negative.   Endo/Heme/Allergies: Negative.   Psychiatric/Behavioral: Negative.    All other systems reviewed and are negative.     All other systems are reviewed and negative.    PHYSICAL  EXAM: VS:  BP 120/80   Pulse (!) 58   Ht 5\' 10"  (1.778 m)   Wt 173 lb 9.6 oz (78.7 kg)   SpO2 96%   BMI 24.91 kg/m  , BMI Body mass index is 24.91 kg/m. Last weight:  Wt Readings from Last 3 Encounters:  11/20/23 173 lb 9.6 oz (78.7 kg)  10/26/23 175 lb (79.4 kg)  09/28/23 173 lb (78.5 kg)     Physical Exam Vitals reviewed.  Constitutional:      Appearance: Normal appearance. He is normal weight.  HENT:     Head: Normocephalic.     Nose: Nose normal.     Mouth/Throat:     Mouth: Mucous membranes are moist.  Eyes:     Pupils: Pupils are equal, round, and reactive to light.  Cardiovascular:     Rate and Rhythm: Normal rate and regular rhythm.     Pulses: Normal pulses.     Heart sounds: Normal heart sounds.  Pulmonary:     Effort: Pulmonary effort is normal.  Abdominal:     General: Abdomen is flat. Bowel sounds are normal.  Musculoskeletal:        General: Normal range of motion.     Cervical back: Normal range of motion.  Skin:    General: Skin is warm.  Neurological:     General: No focal deficit present.     Mental Status: He is alert.  Psychiatric:        Mood and Affect: Mood normal.       EKG:   Recent Labs: 09/19/2023: ALT 8; BUN 22; Creatinine, Ser 1.33; Potassium 4.6; Sodium 144    Lipid Panel    Component Value Date/Time   CHOL 115 09/19/2023 0836   TRIG 72 09/19/2023 0836   HDL 61 09/19/2023 0836   CHOLHDL 1.9 09/19/2023 0836   LDLCALC 39 09/19/2023 0836      Other studies Reviewed: Additional studies/ records that were reviewed today include:  Review of the above records demonstrates:       No data to display            ASSESSMENT AND PLAN:    ICD-10-CM   1. Mixed hyperlipidemia  E78.2     2. Sinus bradycardia  R00.1     3. Coronary artery disease involving native coronary artery of native heart without angina pectoris  I25.10  4. SOB (shortness of breath)  R06.02     5. Primary hypertension  I10    BP is normal  today as was 160 systolic last visit       Problem List Items Addressed This Visit       Cardiovascular and Mediastinum   Primary hypertension     Other   Mixed hyperlipidemia - Primary   Other Visit Diagnoses       Sinus bradycardia         Coronary artery disease involving native coronary artery of native heart without angina pectoris         SOB (shortness of breath)              Disposition:   Return in about 3 months (around 02/20/2024).    Total time spent: 45 minutes  Signed,  Debborah Fairly, MD  11/20/2023 2:40 PM    [facility]

## 2023-12-18 DIAGNOSIS — M79675 Pain in left toe(s): Secondary | ICD-10-CM | POA: Diagnosis not present

## 2023-12-18 DIAGNOSIS — L600 Ingrowing nail: Secondary | ICD-10-CM | POA: Diagnosis not present

## 2023-12-18 DIAGNOSIS — B351 Tinea unguium: Secondary | ICD-10-CM | POA: Diagnosis not present

## 2023-12-18 DIAGNOSIS — M79674 Pain in right toe(s): Secondary | ICD-10-CM | POA: Diagnosis not present

## 2023-12-29 ENCOUNTER — Other Ambulatory Visit

## 2023-12-29 DIAGNOSIS — E782 Mixed hyperlipidemia: Secondary | ICD-10-CM

## 2023-12-29 DIAGNOSIS — R7303 Prediabetes: Secondary | ICD-10-CM | POA: Diagnosis not present

## 2023-12-29 LAB — HEMOGLOBIN A1C
Est. average glucose Bld gHb Est-mCnc: 117 mg/dL
Hgb A1c MFr Bld: 5.7 % — ABNORMAL HIGH (ref 4.8–5.6)

## 2023-12-30 LAB — COMPREHENSIVE METABOLIC PANEL WITH GFR
ALT: 8 IU/L (ref 0–44)
AST: 10 IU/L (ref 0–40)
Albumin: 4.1 g/dL (ref 3.7–4.7)
Alkaline Phosphatase: 55 IU/L (ref 44–121)
BUN/Creatinine Ratio: 22 (ref 10–24)
BUN: 31 mg/dL — ABNORMAL HIGH (ref 8–27)
Bilirubin Total: 0.4 mg/dL (ref 0.0–1.2)
CO2: 19 mmol/L — ABNORMAL LOW (ref 20–29)
Calcium: 9.3 mg/dL (ref 8.6–10.2)
Chloride: 108 mmol/L — ABNORMAL HIGH (ref 96–106)
Creatinine, Ser: 1.38 mg/dL — ABNORMAL HIGH (ref 0.76–1.27)
Globulin, Total: 2 g/dL (ref 1.5–4.5)
Glucose: 95 mg/dL (ref 70–99)
Potassium: 4.8 mmol/L (ref 3.5–5.2)
Sodium: 140 mmol/L (ref 134–144)
Total Protein: 6.1 g/dL (ref 6.0–8.5)
eGFR: 49 mL/min/{1.73_m2} — ABNORMAL LOW (ref >59)

## 2023-12-30 LAB — LIPID PANEL
Chol/HDL Ratio: 2 ratio (ref 0.0–5.0)
Cholesterol, Total: 126 mg/dL (ref 100–199)
HDL: 63 mg/dL (ref >39)
LDL Chol Calc (NIH): 46 mg/dL (ref 0–99)
Triglycerides: 87 mg/dL (ref 0–149)
VLDL Cholesterol Cal: 17 mg/dL (ref 5–40)

## 2024-01-02 ENCOUNTER — Ambulatory Visit: Payer: Self-pay | Admitting: Internal Medicine

## 2024-01-02 ENCOUNTER — Ambulatory Visit (INDEPENDENT_AMBULATORY_CARE_PROVIDER_SITE_OTHER): Admitting: Internal Medicine

## 2024-01-02 DIAGNOSIS — Z0130 Encounter for examination of blood pressure without abnormal findings: Secondary | ICD-10-CM

## 2024-01-02 DIAGNOSIS — E782 Mixed hyperlipidemia: Secondary | ICD-10-CM

## 2024-01-02 DIAGNOSIS — N401 Enlarged prostate with lower urinary tract symptoms: Secondary | ICD-10-CM

## 2024-01-02 DIAGNOSIS — N138 Other obstructive and reflux uropathy: Secondary | ICD-10-CM | POA: Diagnosis not present

## 2024-01-02 MED ORDER — TAMSULOSIN HCL 0.4 MG PO CAPS: 0.4000 mg | ORAL_CAPSULE | Freq: Every morning | ORAL | 3 refills | Status: AC

## 2024-01-02 NOTE — Progress Notes (Signed)
 Established Patient Office Visit  Subjective:  Patient ID: Paul Gaines, male    DOB: 02-10-1933  Age: 89 y.o. MRN: [id number]  Chief Complaint  Patient presents with   Follow-up    3 month lab results    No new complaints, here for lab review and medication refills. Labs reviewed and notable for well controlled lipids, stable renal function at [facility]. Reports nocturia but inadvertently stopped taking flomax .     No other concerns at this time.   Past Medical History:  Diagnosis Date   Arthritis    Asthma    BCC (basal cell carcinoma) 09/15/2022   superficial at left upper back, [facility] 11/30/22   Cancer (HCC)    hx of skin cancer on nose   Chronic kidney disease    frequency   Coronary artery disease    stent - 2001    Dysplastic nevus 08/13/2014   left post distal deltoid   Hypertension    Legally blind    Myocardial infarction (HCC)    Pneumonia    hx of several times per pt    Shortness of breath    with exertion    Squamous cell carcinoma in situ (SCCIS) 05/24/2022   left temple, Mohs 07/20/2022   Squamous cell carcinoma of skin 07/01/2010   left infraorbital (in situ)    Past Surgical History:  Procedure Laterality Date   CORONARY ANGIOPLASTY     1988, stents    EYE SURGERY     right eye surgery , macular transrelocation    HERNIA REPAIR     KNEE ARTHROSCOPY     left    TONSILLECTOMY     TOTAL KNEE ARTHROPLASTY  03/26/2012   Procedure: TOTAL KNEE ARTHROPLASTY;  Surgeon: Aurther Blue, MD;  Location: [facility];  Service: Orthopedics;  Laterality: Left;    Social History   Socioeconomic History   Marital status: Married    Spouse name: Not on file   Number of children: Not on file   Years of education: Not on file   Highest education level: Not on file  Occupational History   Not on file  Tobacco Use   Smoking status: Former    Current packs/day: 0.00    Types: Cigarettes    Quit date: 07/18/1976    Years since quitting: 47.4   Smokeless tobacco:  Never  Substance and Sexual Activity   Alcohol use: Never    Comment: infrequent per pt    Drug use: No   Sexual activity: Not on file  Other Topics Concern   Not on file  Social History Narrative   Not on file   Social Drivers of Health   Financial Resource Strain: Not on file  Food Insecurity: Not on file  Transportation Needs: Not on file  Physical Activity: Not on file  Stress: Not on file  Social Connections: Not on file  Intimate Partner Violence: Not on file    No family history on file.  Allergies  Allergen Reactions   Celecoxib     Reaction=heart attack per patient   Lisinopril     Outpatient Medications Prior to Visit  Medication Sig   amLODipine  (NORVASC ) 10 MG tablet Take 1 tablet (10 mg total) by mouth daily.   amLODipine  (NORVASC ) 10 MG tablet Take 1 tablet (10 mg total) by mouth daily.   aspirin EC 81 MG tablet Take 81 mg by mouth daily.   carvedilol  (COREG ) 6.25 MG tablet Take 1 tablet (  6.25 mg total) by mouth 2 (two) times daily.   dorzolamide-timolol  (COSOPT) 2-0.5 % ophthalmic solution Place 1 drop into both eyes 2 (two) times daily.   fluticasone (FLONASE) 50 MCG/ACT nasal spray USE 1 SPRAY IN EACH NOSTRIL IN THE MORNING AS DIRECTED   losartan  (COZAAR ) 100 MG tablet Take 1 tablet (100 mg total) by mouth daily.   Multiple Vitamin (MULTIVITAMIN WITH MINERALS) TABS tablet Take 1 tablet by mouth daily.   pravastatin (PRAVACHOL) 40 MG tablet TAKE 1 TABLET EVERY DAY   spironolactone  (ALDACTONE ) 25 MG tablet Take 0.5 tablets (12.5 mg total) by mouth daily.   timolol  (BETIMOL ) 0.5 % ophthalmic solution Place 1 drop into both eyes every morning.   Accu-Chek Softclix Lancets lancets TEST TWO TIMES DAILY (Patient not taking: Reported on 01/02/2024)   prednisoLONE  acetate (PRED FORTE ) 1 % ophthalmic suspension Place 1 drop into the right eye every morning. (Patient not taking: Reported on 01/02/2024)   Psyllium (METAMUCIL PO) Take 1 tablet by mouth every morning.  (Patient not taking: Reported on 01/02/2024)   trospium (SANCTURA) 20 MG tablet Take 20 mg by mouth at bedtime. (Patient not taking: Reported on 01/02/2024)   [DISCONTINUED] carvedilol  (COREG ) 12.5 MG tablet TAKE 1 TABLET TWICE DAILY (Patient not taking: Reported on 01/02/2024)   [DISCONTINUED] iron  polysaccharides (NIFEREX) 150 MG capsule Take 1 capsule (150 mg total) by mouth 2 (two) times daily. (Patient not taking: Reported on 01/02/2024)   [DISCONTINUED] tamsulosin  (FLOMAX ) 0.4 MG CAPS capsule Take 1 capsule (0.4 mg total) by mouth every morning. (Patient not taking: Reported on 01/02/2024)   No facility-administered medications prior to visit.    Review of Systems  Constitutional:  Negative for malaise/fatigue and weight loss (gained 4 lbs).  HENT: Negative.    Eyes: Negative.   Respiratory:  Negative for cough.   Cardiovascular:  Negative for chest pain and palpitations.  Gastrointestinal: Negative.   Neurological:  Negative for dizziness.  Psychiatric/Behavioral: Negative.    All other systems reviewed and are negative.      Objective:   BP 114/78   Pulse (!) 51   Temp (!) 97 F (36.1 C)   Ht 5' 10 (1.778 m)   Wt 177 lb 6.4 oz (80.5 kg)   SpO2 98%   BMI 25.45 kg/m   Vitals:   01/02/24 1017  BP: 114/78  Pulse: (!) 51  Temp: (!) 97 F (36.1 C)  Height: 5' 10 (1.778 m)  Weight: 177 lb 6.4 oz (80.5 kg)  SpO2: 98%  BMI (Calculated): 25.45    Physical Exam Vitals reviewed.  Constitutional:      Appearance: Normal appearance.  HENT:     Head: Normocephalic.     Left Ear: There is no impacted cerumen.     Nose: Nose normal.     Mouth/Throat:     Mouth: Mucous membranes are moist.     Pharynx: No posterior oropharyngeal erythema.   Eyes:     Comments: Poor visual acuity bilaterally   Cardiovascular:     Rate and Rhythm: Regular rhythm.     Chest Wall: PMI is not displaced.     Pulses: Normal pulses.     Heart sounds: Normal heart sounds. No murmur  heard. Pulmonary:     Effort: Pulmonary effort is normal.     Breath sounds: Normal air entry. No rhonchi or rales.  Abdominal:     General: Abdomen is flat. Bowel sounds are normal. There is no distension.  Palpations: Abdomen is soft. There is no hepatomegaly, splenomegaly or mass.     Tenderness: There is no abdominal tenderness.   Musculoskeletal:        General: Normal range of motion.     Cervical back: Normal range of motion and neck supple.     Right lower leg: No edema.     Left lower leg: No edema.   Skin:    General: Skin is warm and dry.     Comments: Wart like lesion on right groin.   Neurological:     General: No focal deficit present.     Mental Status: He is alert and oriented to person, place, and time.     Cranial Nerves: No cranial nerve deficit.     Motor: No weakness.   Psychiatric:        Mood and Affect: Mood normal.        Behavior: Behavior normal.      No results found for any visits on 01/02/24.  Recent Results (from the past 2160 hours)  Hemoglobin A1c     Status: Abnormal   Collection Time: 12/29/23  8:26 AM  Result Value Ref Range   Hgb A1c MFr Bld 5.7 (H) 4.8 - 5.6 %    Comment:          Prediabetes: 5.7 - 6.4          Diabetes: >6.4          Glycemic control for adults with diabetes: <7.0    Est. average glucose Bld gHb Est-mCnc 117 mg/dL  Comprehensive metabolic panel     Status: Abnormal   Collection Time: 12/29/23  8:26 AM  Result Value Ref Range   Glucose 95 70 - 99 mg/dL   BUN 31 (H) 8 - 27 mg/dL   Creatinine, Ser 6.57 (H) 0.76 - 1.27 mg/dL   eGFR 49 (L) >84 ON/GEX/5.28   BUN/Creatinine Ratio 22 10 - 24   Sodium 140 134 - 144 mmol/L   Potassium 4.8 3.5 - 5.2 mmol/L   Chloride 108 (H) 96 - 106 mmol/L   CO2 19 (L) 20 - 29 mmol/L   Calcium 9.3 8.6 - 10.2 mg/dL   Total Protein 6.1 6.0 - 8.5 g/dL   Albumin 4.1 3.7 - 4.7 g/dL   Globulin, Total 2.0 1.5 - 4.5 g/dL   Bilirubin Total 0.4 0.0 - 1.2 mg/dL   Alkaline Phosphatase  55 44 - 121 IU/L   AST 10 0 - 40 IU/L   ALT 8 0 - 44 IU/L  Lipid panel     Status: None   Collection Time: 12/29/23  8:26 AM  Result Value Ref Range   Cholesterol, Total 126 100 - 199 mg/dL   Triglycerides 87 0 - 149 mg/dL   HDL 63 >41 mg/dL   VLDL Cholesterol Cal 17 5 - 40 mg/dL   LDL Chol Calc (NIH) 46 0 - 99 mg/dL   Chol/HDL Ratio 2.0 0.0 - 5.0 ratio    Comment:                                   T. Chol/HDL Ratio                                             Men  Women                               1/2 Avg.Risk  3.4    3.3                                   Avg.Risk  5.0    4.4                                2X Avg.Risk  9.6    7.1                                3X Avg.Risk 23.4   11.0       Assessment & Plan:  As per problem list. Resume tamsulosin . Problem List Items Addressed This Visit       Genitourinary   BPH with obstruction/lower urinary tract symptoms   Relevant Medications   tamsulosin  (FLOMAX ) 0.4 MG CAPS capsule     Other   Mixed hyperlipidemia    Return in about 3 months (around 04/03/2024) for fu with labs prior.   Total time spent: 20 minutes  Arzella Bitters, MD  01/02/2024   This document may have been prepared by [facility] Voice Recognition software and as such may include unintentional dictation errors.

## 2024-01-05 ENCOUNTER — Ambulatory Visit: Admitting: Internal Medicine

## 2024-01-06 IMAGING — CR DG CHEST 2V
1 series · 2 of 2 positions shown · non-contrast
Comparison: 02/21/2012

CLINICAL DATA: Productive cough.  Sore throat and body aches.

EXAM:
CHEST - 2 VIEW

[Series 1: dg chest 2 view · 0.14mm/px · 2 of 2 slices shown]
[im 1/2]
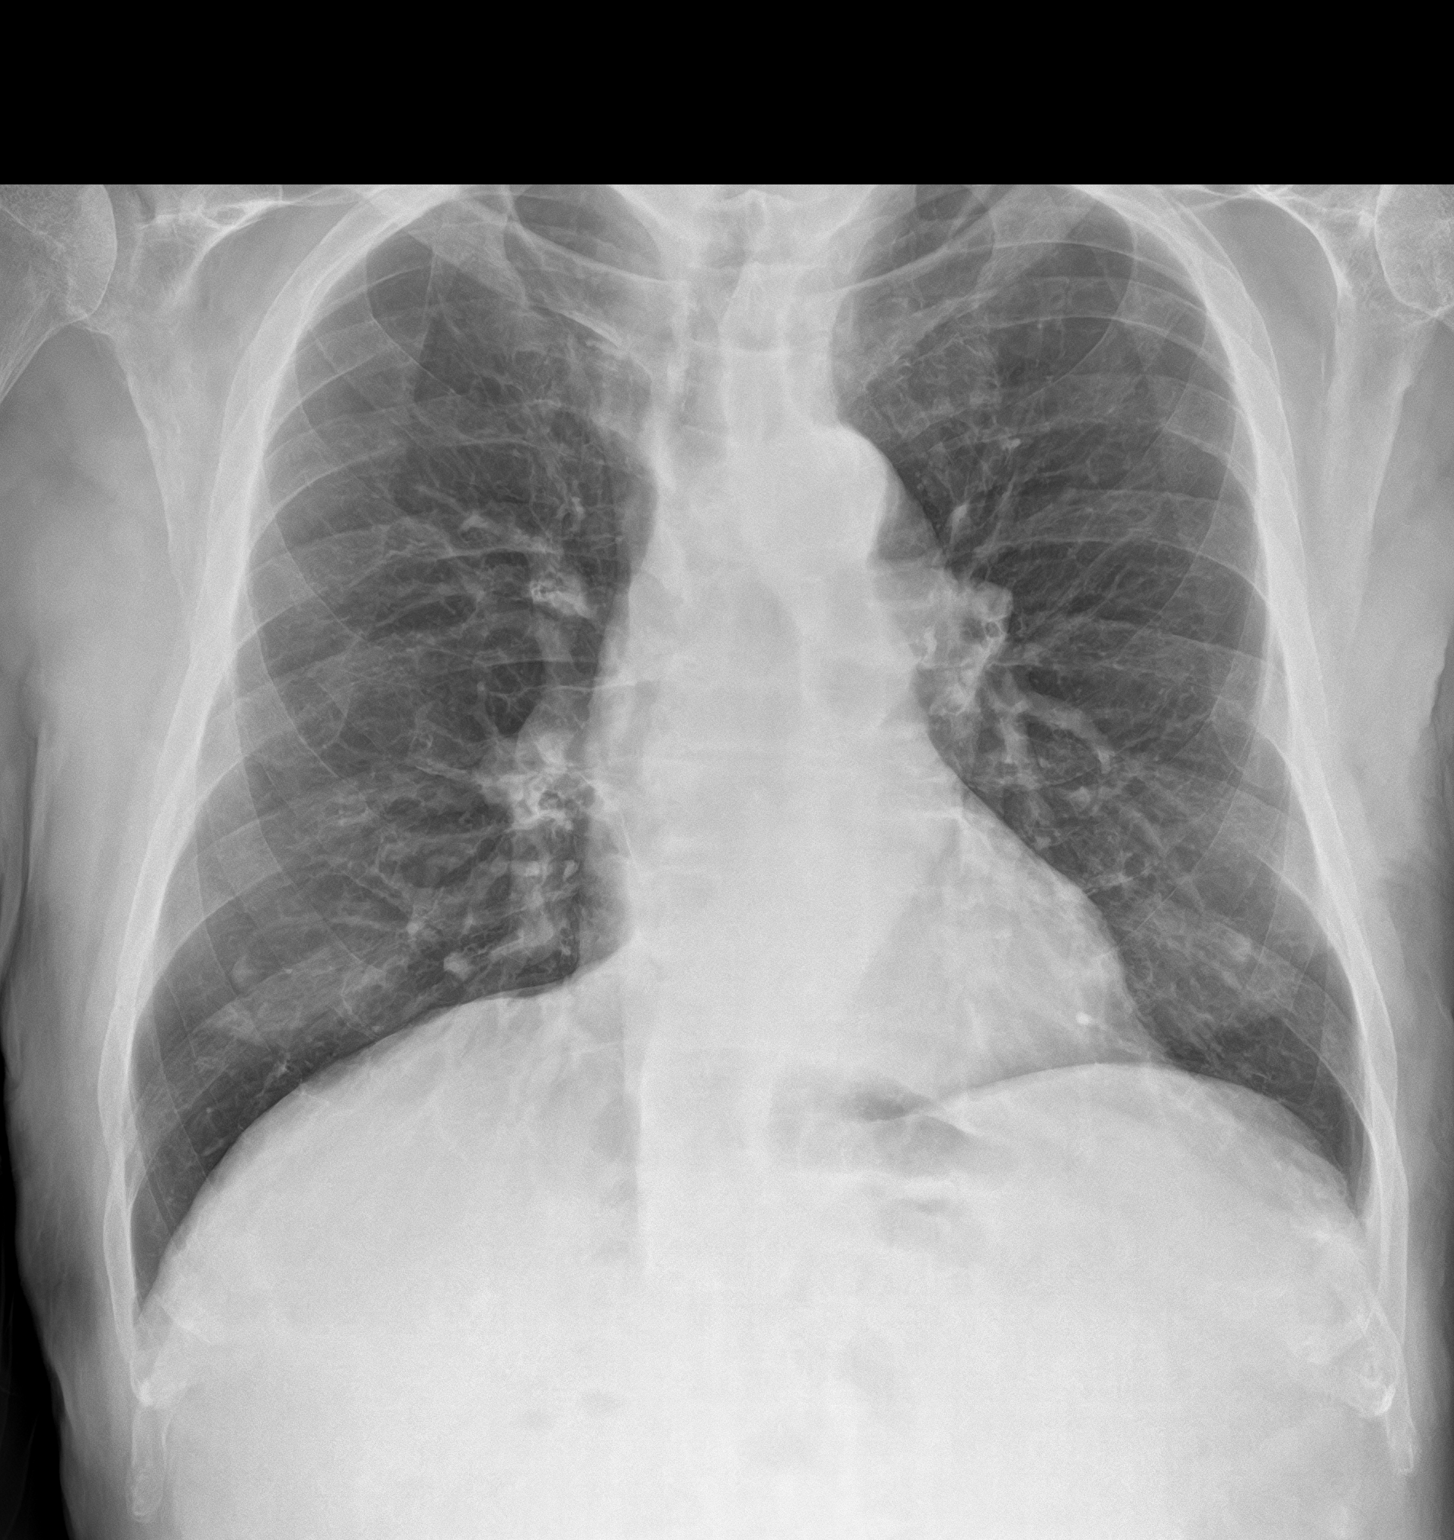
[im 2/2]
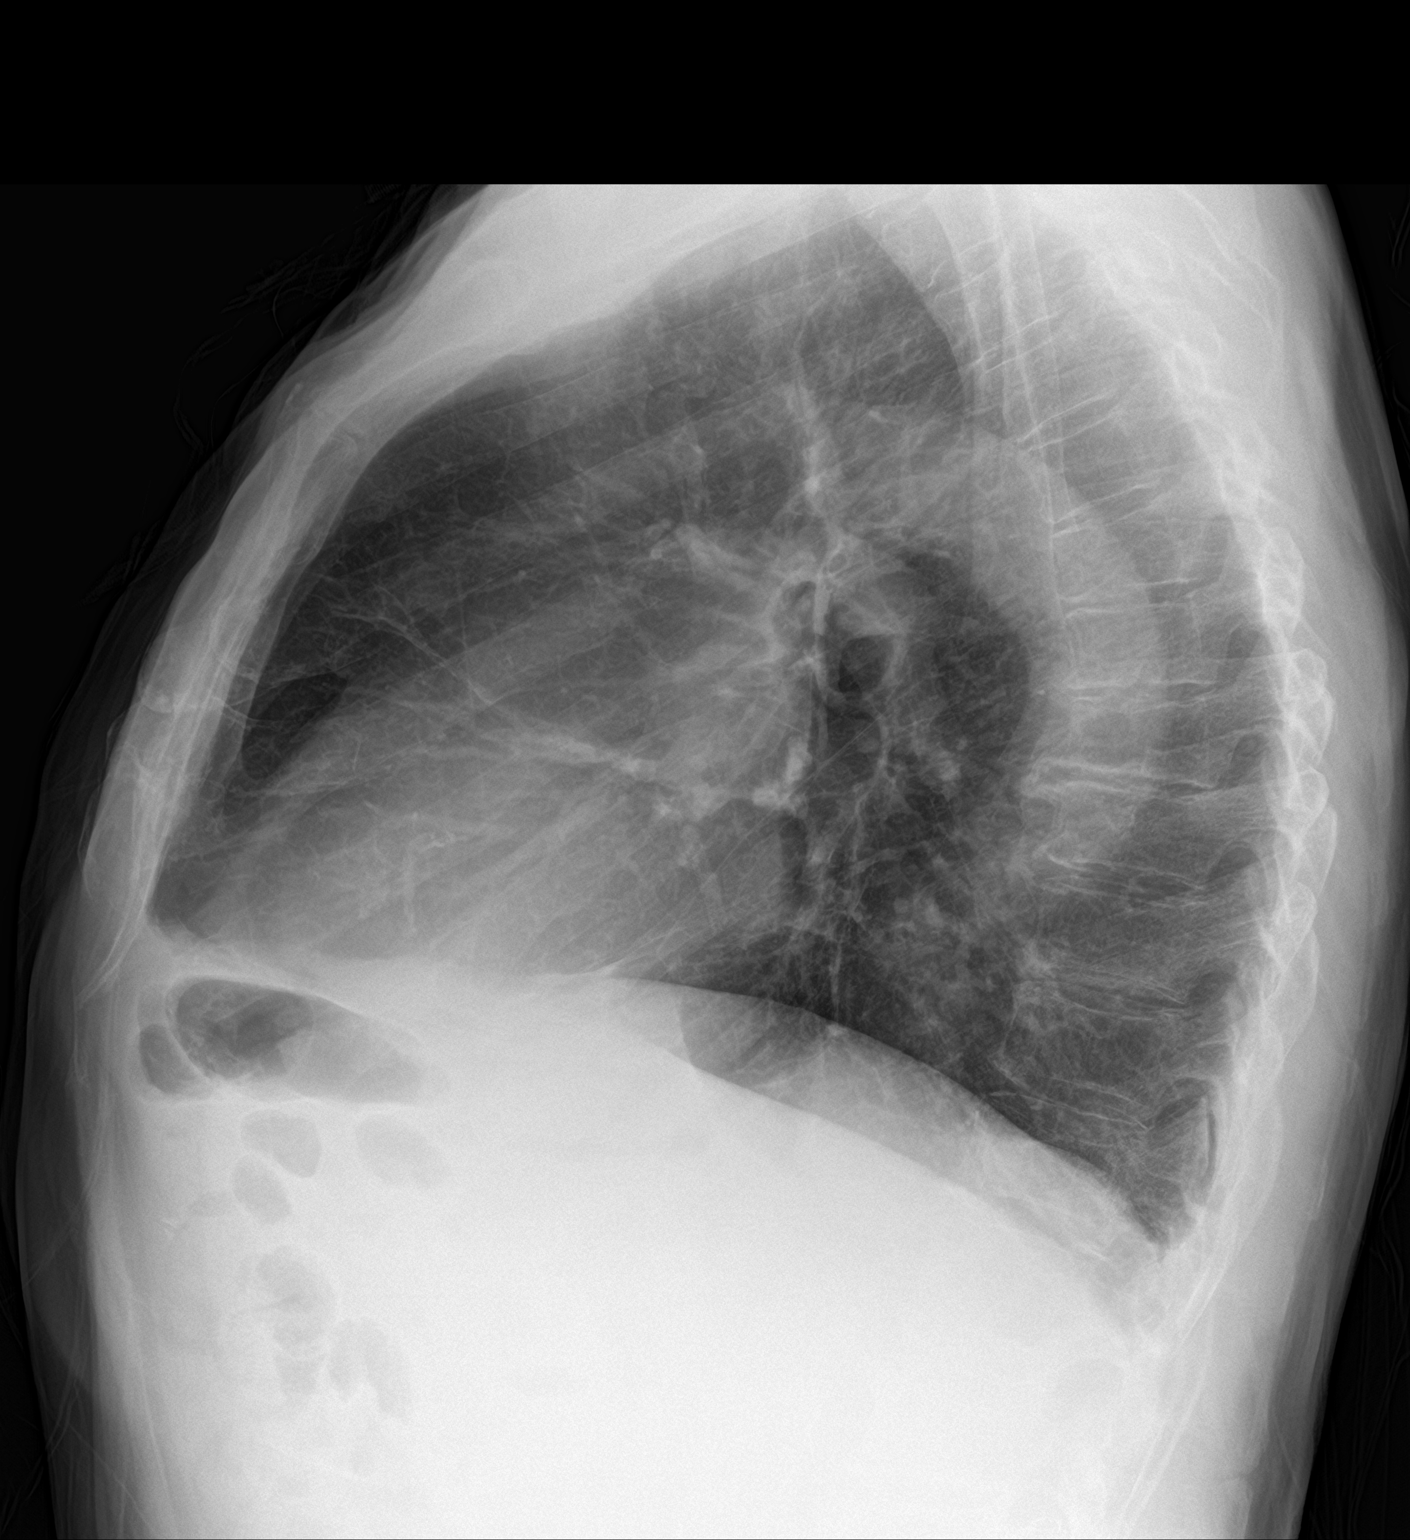

[2 of 2 positions shown; findings below may reference images not displayed]

FINDINGS: Cardiac silhouette normal in size. No mediastinal or hilar masses.
No evidence of adenopathy.

Clear lungs.  No pleural effusion or pneumothorax.

Skeletal structures are intact.
IMPRESSION: No active cardiopulmonary disease.

## 2024-02-06 DIAGNOSIS — E119 Type 2 diabetes mellitus without complications: Secondary | ICD-10-CM | POA: Diagnosis not present

## 2024-02-06 DIAGNOSIS — H353232 Exudative age-related macular degeneration, bilateral, with inactive choroidal neovascularization: Secondary | ICD-10-CM | POA: Diagnosis not present

## 2024-02-06 DIAGNOSIS — H401134 Primary open-angle glaucoma, bilateral, indeterminate stage: Secondary | ICD-10-CM | POA: Diagnosis not present

## 2024-02-06 DIAGNOSIS — H2512 Age-related nuclear cataract, left eye: Secondary | ICD-10-CM | POA: Diagnosis not present

## 2024-02-20 ENCOUNTER — Ambulatory Visit: Admitting: Cardiovascular Disease

## 2024-03-25 ENCOUNTER — Ambulatory Visit: Admitting: Cardiovascular Disease

## 2024-04-01 ENCOUNTER — Ambulatory Visit: Admitting: Cardiovascular Disease

## 2024-04-01 ENCOUNTER — Other Ambulatory Visit

## 2024-04-01 ENCOUNTER — Encounter: Payer: Self-pay | Admitting: Cardiovascular Disease

## 2024-04-01 VITALS — BP 132/68 | HR 44 | Ht 68.0 in | Wt 179.0 lb

## 2024-04-01 DIAGNOSIS — N179 Acute kidney failure, unspecified: Secondary | ICD-10-CM

## 2024-04-01 DIAGNOSIS — I2510 Atherosclerotic heart disease of native coronary artery without angina pectoris: Secondary | ICD-10-CM

## 2024-04-01 DIAGNOSIS — R001 Bradycardia, unspecified: Secondary | ICD-10-CM | POA: Diagnosis not present

## 2024-04-01 DIAGNOSIS — I10 Essential (primary) hypertension: Secondary | ICD-10-CM

## 2024-04-01 DIAGNOSIS — R0602 Shortness of breath: Secondary | ICD-10-CM | POA: Diagnosis not present

## 2024-04-01 DIAGNOSIS — E782 Mixed hyperlipidemia: Secondary | ICD-10-CM

## 2024-04-01 NOTE — Progress Notes (Signed)
 Cardiology Office Note   Date:  04/01/2024   ID:  Paul Gaines, Paul Gaines 1933/05/15, MRN [id number]  PCP:  Paul GORMAN Dine, MD  Cardiologist:  Denyse Bathe, MD      History of Present Illness: Paul Gaines is a 89 y.o. male who presents for  Chief Complaint  Patient presents with   Follow-up    3 month follow up    Has DOE, no chest pain.      Past Medical History:  Diagnosis Date   Arthritis    Asthma    BCC (basal cell carcinoma) 09/15/2022   superficial at left upper back, EDC 11/30/22   Cancer (HCC)    hx of skin cancer on nose   Chronic kidney disease    frequency   Coronary artery disease    stent - 2001    Dysplastic nevus 08/13/2014   left post distal deltoid   Hypertension    Legally blind    Myocardial infarction (HCC)    Pneumonia    hx of several times per pt    Shortness of breath    with exertion    Squamous cell carcinoma in situ (SCCIS) 05/24/2022   left temple, Mohs 07/20/2022   Squamous cell carcinoma of skin 07/01/2010   left infraorbital (in situ)     Past Surgical History:  Procedure Laterality Date   CORONARY ANGIOPLASTY     1988, stents    EYE SURGERY     right eye surgery , macular transrelocation    HERNIA REPAIR     KNEE ARTHROSCOPY     left    TONSILLECTOMY     TOTAL KNEE ARTHROPLASTY  03/26/2012   Procedure: TOTAL KNEE ARTHROPLASTY;  Surgeon: Paul LULLA Moan, MD;  Location: [facility];  Service: Orthopedics;  Laterality: Left;     Current Outpatient Medications  Medication Sig Dispense Refill   Accu-Chek Softclix Lancets lancets TEST TWO TIMES DAILY 200 each 3   amLODipine  (NORVASC ) 10 MG tablet Take 1 tablet (10 mg total) by mouth daily. 30 tablet 11   amLODipine  (NORVASC ) 10 MG tablet Take 1 tablet (10 mg total) by mouth daily. 30 tablet 11   aspirin EC 81 MG tablet Take 81 mg by mouth daily.     carvedilol  (COREG ) 6.25 MG tablet Take 1 tablet (6.25 mg total) by mouth 2 (two) times daily. 60 tablet 11    dorzolamide-timolol  (COSOPT) 2-0.5 % ophthalmic solution Place 1 drop into both eyes 2 (two) times daily.     fluticasone (FLONASE) 50 MCG/ACT nasal spray USE 1 SPRAY IN EACH NOSTRIL IN THE MORNING AS DIRECTED 32 g 3   losartan  (COZAAR ) 100 MG tablet Take 1 tablet (100 mg total) by mouth daily. 30 tablet 11   Multiple Vitamin (MULTIVITAMIN WITH MINERALS) TABS tablet Take 1 tablet by mouth daily.     pravastatin (PRAVACHOL) 40 MG tablet TAKE 1 TABLET EVERY DAY 90 tablet 3   spironolactone  (ALDACTONE ) 25 MG tablet Take 0.5 tablets (12.5 mg total) by mouth daily. 30 tablet 11   tamsulosin  (FLOMAX ) 0.4 MG CAPS capsule Take 1 capsule (0.4 mg total) by mouth every morning. 90 capsule 3   timolol  (BETIMOL ) 0.5 % ophthalmic solution Place 1 drop into both eyes every morning.     No current facility-administered medications for this visit.    Allergies:   Celecoxib and Lisinopril    Social History:   reports that he quit smoking about 47 years ago.  His smoking use included cigarettes. He has never used smokeless tobacco. He reports that he does not drink alcohol and does not use drugs.   Family History:  family history is not on file.    ROS:     Review of Systems  Constitutional: Negative.   HENT: Negative.    Eyes: Negative.   Respiratory: Negative.    Gastrointestinal: Negative.   Genitourinary: Negative.   Musculoskeletal: Negative.   Skin: Negative.   Neurological: Negative.   Endo/Heme/Allergies: Negative.   Psychiatric/Behavioral: Negative.    All other systems reviewed and are negative.     All other systems are reviewed and negative.    PHYSICAL EXAM: VS:  BP 132/68   Pulse (!) 44   Ht 5' 8 (1.727 m)   Wt 179 lb (81.2 kg)   SpO2 97%   BMI 27.22 kg/m  , BMI Body mass index is 27.22 kg/m. Last weight:  Wt Readings from Last 3 Encounters:  04/01/24 179 lb (81.2 kg)  01/02/24 177 lb 6.4 oz (80.5 kg)  11/20/23 173 lb 9.6 oz (78.7 kg)     Physical Exam Vitals  reviewed.  Constitutional:      Appearance: Normal appearance. He is normal weight.  HENT:     Head: Normocephalic.     Nose: Nose normal.     Mouth/Throat:     Mouth: Mucous membranes are moist.  Eyes:     Pupils: Pupils are equal, round, and reactive to light.  Cardiovascular:     Rate and Rhythm: Normal rate and regular rhythm.     Pulses: Normal pulses.     Heart sounds: Normal heart sounds.  Pulmonary:     Effort: Pulmonary effort is normal.  Abdominal:     General: Abdomen is flat. Bowel sounds are normal.  Musculoskeletal:        General: Normal range of motion.     Cervical back: Normal range of motion.  Skin:    General: Skin is warm.  Neurological:     General: No focal deficit present.     Mental Status: He is alert.  Psychiatric:        Mood and Affect: Mood normal.       EKG:   Recent Labs: 12/29/2023: ALT 8; BUN 31; Creatinine, Ser 1.38; Potassium 4.8; Sodium 140    Lipid Panel    Component Value Date/Time   CHOL 126 12/29/2023 0826   TRIG 87 12/29/2023 0826   HDL 63 12/29/2023 0826   CHOLHDL 2.0 12/29/2023 0826   LDLCALC 46 12/29/2023 0826      Other studies Reviewed: Additional studies/ records that were reviewed today include:  Review of the above records demonstrates:       No data to display            ASSESSMENT AND PLAN:    ICD-10-CM   1. SOB (shortness of breath)  R06.02    Doing well    2. Coronary artery disease involving native coronary artery of native heart without angina pectoris  I25.10     3. Sinus bradycardia  R00.1    HR 44, but no dizziness, no need for PPM at this time. Says exercise to HR 88, after 30 minutes.    4. Mixed hyperlipidemia  E78.2     5. Acute kidney injury (nontraumatic) (HCC)  N17.9    creat 1.38    6. Primary hypertension  I10        Problem List  Items Addressed This Visit       Cardiovascular and Mediastinum   Primary hypertension     Other   Mixed hyperlipidemia   Other Visit  Diagnoses       SOB (shortness of breath)    -  Primary   Doing well     Coronary artery disease involving native coronary artery of native heart without angina pectoris         Sinus bradycardia       HR 44, but no dizziness, no need for PPM at this time. Says exercise to HR 88, after 30 minutes.     Acute kidney injury (nontraumatic) (HCC)       creat 1.38          Disposition:   Return in about 3 months (around 07/01/2024).    Total time spent: 30 minutes  Signed,  Denyse Bathe, MD  04/01/2024 2:20 PM    [facility]

## 2024-04-02 ENCOUNTER — Ambulatory Visit: Admitting: Internal Medicine

## 2024-04-02 ENCOUNTER — Ambulatory Visit (INDEPENDENT_AMBULATORY_CARE_PROVIDER_SITE_OTHER): Admitting: Internal Medicine

## 2024-04-02 ENCOUNTER — Ambulatory Visit: Payer: Self-pay | Admitting: Internal Medicine

## 2024-04-02 VITALS — BP 120/72 | HR 53 | Temp 97.8°F | Ht 70.0 in | Wt 179.0 lb

## 2024-04-02 DIAGNOSIS — R350 Frequency of micturition: Secondary | ICD-10-CM | POA: Diagnosis not present

## 2024-04-02 DIAGNOSIS — E782 Mixed hyperlipidemia: Secondary | ICD-10-CM

## 2024-04-02 DIAGNOSIS — N401 Enlarged prostate with lower urinary tract symptoms: Secondary | ICD-10-CM | POA: Diagnosis not present

## 2024-04-02 DIAGNOSIS — Z0130 Encounter for examination of blood pressure without abnormal findings: Secondary | ICD-10-CM

## 2024-04-02 DIAGNOSIS — N138 Other obstructive and reflux uropathy: Secondary | ICD-10-CM

## 2024-04-02 DIAGNOSIS — N528 Other male erectile dysfunction: Secondary | ICD-10-CM

## 2024-04-02 LAB — COMPREHENSIVE METABOLIC PANEL WITH GFR
ALT: 8 IU/L (ref 0–44)
AST: 9 IU/L (ref 0–40)
Albumin: 4 g/dL (ref 3.7–4.7)
Alkaline Phosphatase: 58 IU/L (ref 48–129)
BUN/Creatinine Ratio: 17 (ref 10–24)
BUN: 25 mg/dL (ref 8–27)
Bilirubin Total: 0.4 mg/dL (ref 0.0–1.2)
CO2: 20 mmol/L (ref 20–29)
Calcium: 9.3 mg/dL (ref 8.6–10.2)
Chloride: 107 mmol/L — ABNORMAL HIGH (ref 96–106)
Creatinine, Ser: 1.45 mg/dL — ABNORMAL HIGH (ref 0.76–1.27)
Globulin, Total: 2.1 g/dL (ref 1.5–4.5)
Glucose: 91 mg/dL (ref 70–99)
Potassium: 4.5 mmol/L (ref 3.5–5.2)
Sodium: 143 mmol/L (ref 134–144)
Total Protein: 6.1 g/dL (ref 6.0–8.5)
eGFR: 46 mL/min/1.73 — ABNORMAL LOW (ref >59)

## 2024-04-02 LAB — LIPID PANEL
Chol/HDL Ratio: 1.8 ratio (ref 0.0–5.0)
Cholesterol, Total: 114 mg/dL (ref 100–199)
HDL: 62 mg/dL (ref >39)
LDL Chol Calc (NIH): 37 mg/dL (ref 0–99)
Triglycerides: 74 mg/dL (ref 0–149)
VLDL Cholesterol Cal: 15 mg/dL (ref 5–40)

## 2024-04-02 MED ORDER — FINASTERIDE 5 MG PO TABS: 5.0000 mg | ORAL_TABLET | Freq: Every day | ORAL | 0 refills | Status: DC

## 2024-04-02 MED ORDER — FINASTERIDE 5 MG PO TABS: 5.0000 mg | ORAL_TABLET | Freq: Every day | ORAL | 0 refills | Status: AC

## 2024-04-02 MED ORDER — SILDENAFIL CITRATE 100 MG PO TABS: 100.0000 mg | ORAL_TABLET | ORAL | 1 refills | Status: DC | PRN

## 2024-04-02 NOTE — Progress Notes (Signed)
 Established Patient Office Visit  Subjective:  Patient ID: Paul Gaines, male    DOB: September 18, 1932  Age: 89 y.o. MRN: [id number]  Chief Complaint  Patient presents with   Follow-up    3 month lab results    No new complaints, here for lab review and medication refills. Labs reviewed and notable for well controlled lipids but slight deterioration in GFR. Reports urinary frequency uncontrolled on current dose of tamsulosin .    No other concerns at this time.   Past Medical History:  Diagnosis Date   Arthritis    Asthma    BCC (basal cell carcinoma) 09/15/2022   superficial at left upper back, EDC 11/30/22   Cancer (HCC)    hx of skin cancer on nose   Chronic kidney disease    frequency   Coronary artery disease    stent - 2001    Dysplastic nevus 08/13/2014   left post distal deltoid   Hypertension    Legally blind    Myocardial infarction (HCC)    Pneumonia    hx of several times per pt    Shortness of breath    with exertion    Squamous cell carcinoma in situ (SCCIS) 05/24/2022   left temple, Mohs 07/20/2022   Squamous cell carcinoma of skin 07/01/2010   left infraorbital (in situ)    Past Surgical History:  Procedure Laterality Date   CORONARY ANGIOPLASTY     1988, stents    EYE SURGERY     right eye surgery , macular transrelocation    HERNIA REPAIR     KNEE ARTHROSCOPY     left    TONSILLECTOMY     TOTAL KNEE ARTHROPLASTY  03/26/2012   Procedure: TOTAL KNEE ARTHROPLASTY;  Surgeon: Dempsey LULLA Moan, MD;  Location: [facility];  Service: Orthopedics;  Laterality: Left;    Social History   Socioeconomic History   Marital status: Married    Spouse name: Not on file   Number of children: Not on file   Years of education: Not on file   Highest education level: Not on file  Occupational History   Not on file  Tobacco Use   Smoking status: Former    Current packs/day: 0.00    Types: Cigarettes    Quit date: 07/18/1976    Years since quitting: 47.7    Smokeless tobacco: Never  Substance and Sexual Activity   Alcohol use: Never    Comment: infrequent per pt    Drug use: No   Sexual activity: Not on file  Other Topics Concern   Not on file  Social History Narrative   Not on file   Social Drivers of Health   Financial Resource Strain: Not on file  Food Insecurity: Not on file  Transportation Needs: Not on file  Physical Activity: Not on file  Stress: Not on file  Social Connections: Not on file  Intimate Partner Violence: Not on file    No family history on file.  Allergies  Allergen Reactions   Celecoxib     Reaction=heart attack per patient   Lisinopril     Outpatient Medications Prior to Visit  Medication Sig   Accu-Chek Softclix Lancets lancets TEST TWO TIMES DAILY   amLODipine  (NORVASC ) 10 MG tablet Take 1 tablet (10 mg total) by mouth daily.   amLODipine  (NORVASC ) 10 MG tablet Take 1 tablet (10 mg total) by mouth daily.   aspirin EC 81 MG tablet Take 81 mg by mouth  daily.   carvedilol  (COREG ) 6.25 MG tablet Take 1 tablet (6.25 mg total) by mouth 2 (two) times daily.   dorzolamide-timolol  (COSOPT) 2-0.5 % ophthalmic solution Place 1 drop into both eyes 2 (two) times daily.   fluticasone (FLONASE) 50 MCG/ACT nasal spray USE 1 SPRAY IN EACH NOSTRIL IN THE MORNING AS DIRECTED   losartan  (COZAAR ) 100 MG tablet Take 1 tablet (100 mg total) by mouth daily.   Multiple Vitamin (MULTIVITAMIN WITH MINERALS) TABS tablet Take 1 tablet by mouth daily.   pravastatin (PRAVACHOL) 40 MG tablet TAKE 1 TABLET EVERY DAY   spironolactone  (ALDACTONE ) 25 MG tablet Take 0.5 tablets (12.5 mg total) by mouth daily.   tamsulosin  (FLOMAX ) 0.4 MG CAPS capsule Take 1 capsule (0.4 mg total) by mouth every morning.   timolol  (BETIMOL ) 0.5 % ophthalmic solution Place 1 drop into both eyes every morning.   No facility-administered medications prior to visit.    Review of Systems  Constitutional:  Negative for malaise/fatigue and weight loss  (gained 2 lbs).  HENT: Negative.    Eyes: Negative.   Respiratory:  Negative for cough.   Cardiovascular:  Negative for chest pain and palpitations.  Gastrointestinal: Negative.   Genitourinary:  Positive for frequency.  Neurological:  Negative for dizziness.  Psychiatric/Behavioral: Negative.    All other systems reviewed and are negative.      Objective:   BP 120/72   Pulse (!) 53   Temp 97.8 F (36.6 C)   Ht 5' 10 (1.778 m)   Wt 179 lb (81.2 kg)   SpO2 96%   BMI 25.68 kg/m   Vitals:   04/02/24 1330  BP: 120/72  Pulse: (!) 53  Temp: 97.8 F (36.6 C)  Height: 5' 10 (1.778 m)  Weight: 179 lb (81.2 kg)  SpO2: 96%  BMI (Calculated): 25.68    Physical Exam Vitals reviewed.  Constitutional:      Appearance: Normal appearance.  HENT:     Head: Normocephalic.     Left Ear: There is no impacted cerumen.     Nose: Nose normal.     Mouth/Throat:     Mouth: Mucous membranes are moist.     Pharynx: No posterior oropharyngeal erythema.  Eyes:     Comments: Poor visual acuity bilaterally  Cardiovascular:     Rate and Rhythm: Regular rhythm.     Chest Wall: PMI is not displaced.     Pulses: Normal pulses.     Heart sounds: Normal heart sounds. No murmur heard. Pulmonary:     Effort: Pulmonary effort is normal.     Breath sounds: Normal air entry. No rhonchi or rales.  Abdominal:     General: Abdomen is flat. Bowel sounds are normal. There is no distension.     Palpations: Abdomen is soft. There is no hepatomegaly, splenomegaly or mass.     Tenderness: There is no abdominal tenderness.  Musculoskeletal:        General: Normal range of motion.     Cervical back: Normal range of motion and neck supple.     Right lower leg: No edema.     Left lower leg: No edema.  Skin:    General: Skin is warm and dry.     Comments: Wart like lesion on right groin.  Neurological:     General: No focal deficit present.     Mental Status: He is alert and oriented to person,  place, and time.     Cranial Nerves: No  cranial nerve deficit.     Motor: No weakness.  Psychiatric:        Mood and Affect: Mood normal.        Behavior: Behavior normal.      No results found for any visits on 04/02/24.  Recent Results (from the past 2160 hours)  Comprehensive metabolic panel     Status: Abnormal   Collection Time: 04/01/24  8:31 AM  Result Value Ref Range   Glucose 91 70 - 99 mg/dL   BUN 25 8 - 27 mg/dL   Creatinine, Ser 8.54 (H) 0.76 - 1.27 mg/dL   eGFR 46 (L) >40 fO/fpw/8.26   BUN/Creatinine Ratio 17 10 - 24   Sodium 143 134 - 144 mmol/L   Potassium 4.5 3.5 - 5.2 mmol/L   Chloride 107 (H) 96 - 106 mmol/L   CO2 20 20 - 29 mmol/L   Calcium 9.3 8.6 - 10.2 mg/dL   Total Protein 6.1 6.0 - 8.5 g/dL   Albumin 4.0 3.7 - 4.7 g/dL   Globulin, Total 2.1 1.5 - 4.5 g/dL   Bilirubin Total 0.4 0.0 - 1.2 mg/dL   Alkaline Phosphatase 58 48 - 129 IU/L    Comment:               **Please note reference interval change**   AST 9 0 - 40 IU/L   ALT 8 0 - 44 IU/L  Lipid panel     Status: None   Collection Time: 04/01/24  8:32 AM  Result Value Ref Range   Cholesterol, Total 114 100 - 199 mg/dL   Triglycerides 74 0 - 149 mg/dL   HDL 62 >60 mg/dL   VLDL Cholesterol Cal 15 5 - 40 mg/dL   LDL Chol Calc (NIH) 37 0 - 99 mg/dL   Chol/HDL Ratio 1.8 0.0 - 5.0 ratio    Comment:                                   T. Chol/HDL Ratio                                             Men  Women                               1/2 Avg.Risk  3.4    3.3                                   Avg.Risk  5.0    4.4                                2X Avg.Risk  9.6    7.1                                3X Avg.Risk 23.4   11.0       Assessment & Plan:  Paul Gaines was seen today for follow-up.  BPH with obstruction/lower urinary tract symptoms -     Finasteride ; Take 1 tablet (5 mg total) by mouth daily.  Dispense: 90 tablet; Refill: 0 -     CBC With Diff/Platelet  Other male erectile dysfunction -      Sildenafil  Citrate; Take 1 tablet (100 mg total) by mouth as needed for erectile dysfunction.  Dispense: 10 tablet; Refill: 1  Mixed hyperlipidemia -     Comprehensive metabolic panel with GFR -     Lipid panel    Problem List Items Addressed This Visit       Genitourinary   BPH with obstruction/lower urinary tract symptoms - Primary   Relevant Medications   finasteride  (PROSCAR ) 5 MG tablet   Other Relevant Orders   CBC With Diff/Platelet     Other   Mixed hyperlipidemia   Relevant Medications   sildenafil  (VIAGRA ) 100 MG tablet   Other Visit Diagnoses       Other male erectile dysfunction       Relevant Medications   sildenafil  (VIAGRA ) 100 MG tablet       Return in about 3 months (around 07/02/2024) for awv with labs prior.   Total time spent: 20 minutes  Sherrill Cinderella Perry, MD  04/02/2024   This document may have been prepared by [facility] Voice Recognition software and as such may include unintentional dictation errors.

## 2024-04-09 ENCOUNTER — Encounter: Payer: Self-pay | Admitting: Dermatology

## 2024-04-09 ENCOUNTER — Ambulatory Visit: Admitting: Dermatology

## 2024-04-09 DIAGNOSIS — D492 Neoplasm of unspecified behavior of bone, soft tissue, and skin: Secondary | ICD-10-CM

## 2024-04-09 DIAGNOSIS — C44222 Squamous cell carcinoma of skin of right ear and external auricular canal: Secondary | ICD-10-CM

## 2024-04-09 NOTE — Progress Notes (Signed)
   Follow-Up Visit   Subjective  Paul Gaines is a 89 y.o. male who presents for the following: spot on right ear. Dur: 6-8 weeks. Has noticed blood on pillow the last 3 nights. Recently has a burning sensation.    The following portions of the chart were reviewed this encounter and updated as appropriate: medications, allergies, medical history  Review of Systems:  No other skin or systemic complaints except as noted in HPI or Assessment and Plan.  Objective  Well appearing patient in no apparent distress; mood and affect are within normal limits.  A focused examination was performed of the following areas: Face, ears  Relevant exam findings are noted in the Assessment and Plan.  Right Mid Helix 7 mm eroded papule   Assessment & Plan     NEOPLASM OF SKIN Right Mid Helix Skin / nail biopsy Type of biopsy: tangential   Informed consent: discussed and consent obtained   Timeout: patient name, date of birth, surgical site, and procedure verified   Procedure prep:  Patient was prepped and draped in usual sterile fashion Prep type:  Isopropyl alcohol Anesthesia: the lesion was anesthetized in a standard fashion   Anesthetic:  1% lidocaine  w/ epinephrine 1-100,000 buffered w/ 8.4% NaHCO3 Instrument used: DermaBlade   Hemostasis achieved with: pressure and aluminum chloride   Outcome: patient tolerated procedure well   Post-procedure details: sterile dressing applied and wound care instructions given   Dressing type: bandage and petrolatum    Specimen 1 - Surgical pathology Differential Diagnosis: AK vs SCC  Check Margins: No  Return for Follow Up As Scheduled.  I, Jill Parcell, CMA, am acting as scribe for Boneta Sharps, MD.   Documentation: I have reviewed the above documentation for accuracy and completeness, and I agree with the above.  Boneta Sharps, MD

## 2024-04-09 NOTE — Patient Instructions (Signed)

## 2024-04-10 LAB — SURGICAL PATHOLOGY

## 2024-04-11 ENCOUNTER — Ambulatory Visit: Payer: Self-pay | Admitting: Dermatology

## 2024-04-11 DIAGNOSIS — C4492 Squamous cell carcinoma of skin, unspecified: Secondary | ICD-10-CM

## 2024-04-11 NOTE — Telephone Encounter (Signed)
-----   Message from [place]on sent at 04/11/2024  8:50 AM EDT ----- Diagnosis: right mid helix :       WELL DIFFERENTIATED SQUAMOUS CELL CARCINOMA, BASE INVOLVED   Please call with diagnosis and refer to Mohs surgery.  Explanation: This is a squamous cell skin cancer that has grown beyond the surface of the skin and is invading the second layer of the skin. It has the potential to spread beyond the skin and  threaten your health, so I recommend treating it.  Treatment: Given the location and type of skin cancer, I recommend Mohs surgery. Mohs surgery involves cutting out the skin cancer and then checking under the microscope to ensure the whole skin  cancer was removed. If any skin cancer remains, the surgeon will cut out more until it is fully removed. The cure rate is about 98-99%. Once the Mohs surgeon confirms the skin cancer is out, they  will discuss the options to repair or heal the area. You must take it easy for about two weeks after surgery (no lifting over 10-15 lbs, avoid activity to get your heart rate and blood pressure up).  It is done at another office outside of [place] ([place], [place], or [place]). ----- Message ----- From: Interface, Lab In [facility] Sent: 04/10/2024   5:35 PM EDT To: Boneta Sharps, MD

## 2024-04-11 NOTE — Telephone Encounter (Signed)
 Discussed pathology results and treatment plan. Patient voiced understanding. Referral sent to Dr. Fain Home.

## 2024-04-16 ENCOUNTER — Telehealth: Payer: Self-pay

## 2024-04-16 NOTE — Telephone Encounter (Signed)
 Patient called our office to cancel his mohs procedure to treat the Well Differentiated SCC on his right mid helix. He is blind and his wife is 88 years old who does not drive out of town. He does not have family locally that would be able to drive him to his appointment. He says that he is going to have a Biomedical engineer remove the cancer. He has had mohs surgery before and said that they removed those lesions with one pass, so it would be no different than a local surgeon doing it. I explained that we would be able to provide the highest cure rate with mohs with the smallest wound, and that I can assist with finding him transportation if that was preventing him from having the treatment he wanted. He advised that while the transportation was the initial hindrance, it just made them come to the decision that he did not need to travel all this way since his previous mohs surgeries were only one layer and would be similar to a Development worker, international aid.

## 2024-05-11 DIAGNOSIS — H353290 Exudative age-related macular degeneration, unspecified eye, stage unspecified: Secondary | ICD-10-CM | POA: Diagnosis not present

## 2024-05-11 DIAGNOSIS — J301 Allergic rhinitis due to pollen: Secondary | ICD-10-CM | POA: Diagnosis not present

## 2024-05-11 DIAGNOSIS — R001 Bradycardia, unspecified: Secondary | ICD-10-CM | POA: Diagnosis not present

## 2024-05-11 DIAGNOSIS — I130 Hypertensive heart and chronic kidney disease with heart failure and stage 1 through stage 4 chronic kidney disease, or unspecified chronic kidney disease: Secondary | ICD-10-CM | POA: Diagnosis not present

## 2024-05-11 DIAGNOSIS — I2510 Atherosclerotic heart disease of native coronary artery without angina pectoris: Secondary | ICD-10-CM | POA: Diagnosis not present

## 2024-05-11 DIAGNOSIS — I252 Old myocardial infarction: Secondary | ICD-10-CM | POA: Diagnosis not present

## 2024-05-11 DIAGNOSIS — R32 Unspecified urinary incontinence: Secondary | ICD-10-CM | POA: Diagnosis not present

## 2024-05-11 DIAGNOSIS — Z7982 Long term (current) use of aspirin: Secondary | ICD-10-CM | POA: Diagnosis not present

## 2024-05-11 DIAGNOSIS — N400 Enlarged prostate without lower urinary tract symptoms: Secondary | ICD-10-CM | POA: Diagnosis not present

## 2024-05-11 DIAGNOSIS — N1831 Chronic kidney disease, stage 3a: Secondary | ICD-10-CM | POA: Diagnosis not present

## 2024-05-11 DIAGNOSIS — I509 Heart failure, unspecified: Secondary | ICD-10-CM | POA: Diagnosis not present

## 2024-05-11 DIAGNOSIS — Z5982 Transportation insecurity: Secondary | ICD-10-CM | POA: Diagnosis not present

## 2024-05-11 DIAGNOSIS — E785 Hyperlipidemia, unspecified: Secondary | ICD-10-CM | POA: Diagnosis not present

## 2024-05-14 ENCOUNTER — Encounter: Admitting: Dermatology

## 2024-06-03 ENCOUNTER — Ambulatory Visit: Payer: Medicare HMO | Admitting: Dermatology

## 2024-06-03 ENCOUNTER — Encounter: Payer: Self-pay | Admitting: Dermatology

## 2024-06-03 DIAGNOSIS — D485 Neoplasm of uncertain behavior of skin: Secondary | ICD-10-CM

## 2024-06-03 DIAGNOSIS — D1801 Hemangioma of skin and subcutaneous tissue: Secondary | ICD-10-CM

## 2024-06-03 DIAGNOSIS — Z85828 Personal history of other malignant neoplasm of skin: Secondary | ICD-10-CM

## 2024-06-03 DIAGNOSIS — Z1283 Encounter for screening for malignant neoplasm of skin: Secondary | ICD-10-CM | POA: Diagnosis not present

## 2024-06-03 DIAGNOSIS — L814 Other melanin hyperpigmentation: Secondary | ICD-10-CM | POA: Diagnosis not present

## 2024-06-03 DIAGNOSIS — L578 Other skin changes due to chronic exposure to nonionizing radiation: Secondary | ICD-10-CM | POA: Diagnosis not present

## 2024-06-03 DIAGNOSIS — W908XXA Exposure to other nonionizing radiation, initial encounter: Secondary | ICD-10-CM

## 2024-06-03 DIAGNOSIS — L821 Other seborrheic keratosis: Secondary | ICD-10-CM

## 2024-06-03 DIAGNOSIS — L820 Inflamed seborrheic keratosis: Secondary | ICD-10-CM | POA: Diagnosis not present

## 2024-06-03 DIAGNOSIS — D2361 Other benign neoplasm of skin of right upper limb, including shoulder: Secondary | ICD-10-CM

## 2024-06-03 DIAGNOSIS — D225 Melanocytic nevi of trunk: Secondary | ICD-10-CM | POA: Diagnosis not present

## 2024-06-03 DIAGNOSIS — L570 Actinic keratosis: Secondary | ICD-10-CM | POA: Diagnosis not present

## 2024-06-03 DIAGNOSIS — D229 Melanocytic nevi, unspecified: Secondary | ICD-10-CM

## 2024-06-03 NOTE — Patient Instructions (Signed)
 Wound Care Instructions  Cleanse wound gently with soap and water once a day then pat dry with clean gauze. Apply a thin coat of Petrolatum (petroleum jelly, Vaseline) over the wound (unless you have an allergy to this). We recommend that you use a new, sterile tube of Vaseline. Do not pick or remove scabs. Do not remove the yellow or white healing tissue from the base of the wound.  Cover the wound with fresh, clean, nonstick gauze and secure with paper tape. You may use Band-Aids in place of gauze and tape if the wound is small enough, but would recommend trimming much of the tape off as there is often too much. Sometimes Band-Aids can irritate the skin.  You should call the office for your biopsy report after 1 week if you have not already been contacted.  If you experience any problems, such as abnormal amounts of bleeding, swelling, significant bruising, significant pain, or evidence of infection, please call the office immediately.  FOR ADULT SURGERY PATIENTS: If you need something for pain relief you may take 1 extra strength Tylenol  (acetaminophen ) AND 2 Ibuprofen (200mg  each) together every 4 hours as needed for pain. (do not take these if you are allergic to them or if you have a reason you should not take them.) Typically, you may only need pain medication for 1 to 3 days.     Melanoma ABCDEs  Melanoma is the most dangerous type of skin cancer, and is the leading cause of death from skin disease.  You are more likely to develop melanoma if you: Have light-colored skin, light-colored eyes, or red or blond hair Spend a lot of time in the sun Tan regularly, either outdoors or in a tanning bed Have had blistering sunburns, especially during childhood Have a close family member who has had a melanoma Have atypical moles or large birthmarks  Early detection of melanoma is key since treatment is typically straightforward and cure rates are extremely high if we catch it early.   The  first sign of melanoma is often a change in a mole or a new dark spot.  The ABCDE system is a way of remembering the signs of melanoma.  A for asymmetry:  The two halves do not match. B for border:  The edges of the growth are irregular. C for color:  A mixture of colors are present instead of an even brown color. D for diameter:  Melanomas are usually (but not always) greater than 6mm - the size of a pencil eraser. E for evolution:  The spot keeps changing in size, shape, and color.  Please check your skin once per month between visits. You can use a small mirror in front and a large mirror behind you to keep an eye on the back side or your body.   If you see any new or changing lesions before your next follow-up, please call to schedule a visit.  Please continue daily skin protection including broad spectrum sunscreen SPF 30+ to sun-exposed areas, reapplying every 2 hours as needed when you're outdoors.    Due to recent changes in healthcare laws, you may see results of your pathology and/or laboratory studies on MyChart before the doctors have had a chance to review them. We understand that in some cases there may be results that are confusing or concerning to you. Please understand that not all results are received at the same time and often the doctors may need to interpret multiple results in order to  provide you with the best plan of care or course of treatment. Therefore, we ask that you please give us  2 business days to thoroughly review all your results before contacting the office for clarification. Should we see a critical lab result, you will be contacted sooner.   If You Need Anything After Your Visit  If you have any questions or concerns for your doctor, please call our main line at [phone] and press option 4 to reach your doctor's medical assistant. If no one answers, please leave a voicemail as directed and we will return your call as soon as possible. Messages left after 4  pm will be answered the following business day.   You may also send us  a message via MyChart. We typically respond to MyChart messages within 1-2 business days.  For prescription refills, please ask your pharmacy to contact our office. Our fax number is [phone].  If you have an urgent issue when the clinic is closed that cannot wait until the next business day, you can page your doctor at the number below.    Please note that while we do our best to be available for urgent issues outside of office hours, we are not available 24/7.   If you have an urgent issue and are unable to reach us , you may choose to seek medical care at your doctor's office, retail clinic, urgent care center, or emergency room.  If you have a medical emergency, please immediately call 911 or go to the emergency department.  Pager Numbers  - Dr. Hester: [phone]  - Dr. Jackquline: [phone]  - Dr. Claudene: [phone]   - Dr. Raymund: [phone]  In the event of inclement weather, please call our main line at [phone] for an update on the status of any delays or closures.  Dermatology Medication Tips: Please keep the boxes that topical medications come in in order to help keep track of the instructions about where and how to use these. Pharmacies typically print the medication instructions only on the boxes and not directly on the medication tubes.   If your medication is too expensive, please contact our office at [phone] option 4 or send us  a message through MyChart.   We are unable to tell what your co-pay for medications will be in advance as this is different depending on your insurance coverage. However, we may be able to find a substitute medication at lower cost or fill out paperwork to get insurance to cover a needed medication.   If a prior authorization is required to get your medication covered by your insurance company, please allow us  1-2 business days to complete this  process.  Drug prices often vary depending on where the prescription is filled and some pharmacies may offer cheaper prices.  The website www.goodrx.com contains coupons for medications through different pharmacies. The prices here do not account for what the cost may be with help from insurance (it may be cheaper with your insurance), but the website can give you the price if you did not use any insurance.  - You can print the associated coupon and take it with your prescription to the pharmacy.  - You may also stop by our office during regular business hours and pick up a GoodRx coupon card.  - If you need your prescription sent electronically to a different pharmacy, notify our office through [facility] or by phone at [phone] option 4.     Si Usted Necesita Algo Despus de Su Visita  Tambin puede enviarnos un mensaje a travs de MyChart. Por lo general respondemos a los mensajes de MyChart en el transcurso de 1 a 2 das hbiles.  Para renovar recetas, por favor pida a su farmacia que se ponga en contacto con nuestra oficina. Randi lakes de fax es [place] [phone].  Si tiene un asunto urgente cuando la clnica est cerrada y que no puede esperar hasta el siguiente da hbil, puede llamar/localizar a su doctor(a) al nmero que aparece a continuacin.   Por favor, tenga en cuenta que aunque hacemos todo lo posible para estar disponibles para asuntos urgentes fuera del horario de [place], no estamos disponibles las 24 horas del da, los 7 [address]  [address] de la [place].   Si tiene un problema urgente y no puede comunicarse con nosotros, puede optar por buscar atencin mdica  en el consultorio de su doctor(a), en una clnica privada, en un centro de atencin urgente o en una sala de emergencias.  Si tiene Engineer, drilling, por favor llame inmediatamente al 911 o vaya a la sala de emergencias.  Nmeros de bper  - Dr. Hester: [phone]  - Dra. Jackquline: [phone]  - Dr.  Claudene: [phone]  - Dra. Kitts: [phone]  En caso de inclemencias del [place], por favor llame a nuestra lnea principal al [phone] para una actualizacin sobre el estado de cualquier retraso o cierre.  Consejos para la medicacin en dermatologa: Por favor, guarde las cajas en las que vienen los medicamentos de uso tpico para ayudarle a seguir las instrucciones sobre dnde y cmo usarlos. Las farmacias generalmente imprimen las instrucciones del medicamento slo en las cajas y no directamente en los tubos del [place].   Si su medicamento es muy caro, por favor, pngase en contacto con landry rieger llamando al [phone] y presione la opcin 4 o envenos un mensaje a travs de Clinical cytogeneticist.   No podemos decirle cul ser su copago por los medicamentos por adelantado ya que esto es diferente dependiendo de la cobertura de su seguro. Sin embargo, es posible que podamos encontrar un medicamento sustituto a Audiological scientist un formulario para que el seguro cubra el medicamento que se considera necesario.   Si se requiere una autorizacin previa para que su compaa de seguros malta su medicamento, por favor permtanos de 1 a 2 das hbiles para completar este proceso.  Los precios de los medicamentos varan con frecuencia dependiendo del Environmental consultant de dnde se surte la receta y alguna farmacias pueden ofrecer precios ms baratos.  El sitio web www.goodrx.com tiene cupones para medicamentos de Health and safety inspector. Los precios aqu no tienen en cuenta lo que podra costar con la ayuda del seguro (puede ser ms barato con su seguro), pero el sitio web puede darle el precio si no utiliz Tourist information centre manager.  - Puede imprimir el cupn correspondiente y llevarlo con su receta a la farmacia.  - Tambin puede pasar por nuestra oficina durante el horario de atencin regular y Education officer, museum una tarjeta de cupones de GoodRx.  - Si necesita que su receta se enve electrnicamente a una farmacia diferente,  informe a nuestra oficina a travs de MyChart de [place] o por telfono llamando al [phone] y presione la opcin 4.

## 2024-06-03 NOTE — Progress Notes (Signed)
 Follow-Up Visit   Subjective  Paul Gaines is a 89 y.o. male who presents for the following: Skin Cancer Screening and Full Body Skin Exam  The patient presents for Total-Body Skin Exam (TBSE) for skin cancer screening and mole check. The patient has spots, moles and lesions to be evaluated, some may be new or changing and the patient may have concern these could be cancer.  Hx SCC, BCC, DN.  The following portions of the chart were reviewed this encounter and updated as appropriate: medications, allergies, medical history  Review of Systems:  No other skin or systemic complaints except as noted in HPI or Assessment and Plan.  Objective  Well appearing patient in no apparent distress; mood and affect are within normal limits.  A full examination was performed including scalp, head, eyes, ears, nose, lips, neck, chest, axillae, abdomen, back, buttocks, bilateral upper extremities, bilateral lower extremities, hands, feet, fingers, toes, fingernails, and toenails. All findings within normal limits unless otherwise noted below.   Relevant physical exam findings are noted in the Assessment and Plan.  R temple x 1, L infraorbital cheek x 2,  L antihelix x 1, L nasal dorsum x 1 (5) Erythematous thin papules/macules with gritty scale.  L medial clavicle x 1 Erythematous stuck-on, waxy papule or plaque right superior clavicle 8 mm brown irregular macule   Assessment & Plan   SKIN CANCER SCREENING PERFORMED TODAY.  ACTINIC DAMAGE - Chronic condition, secondary to cumulative UV/sun exposure - diffuse scaly erythematous macules with underlying dyspigmentation - Recommend daily broad spectrum sunscreen SPF 30+ to sun-exposed areas, reapply every 2 hours as needed.  - Staying in the shade or wearing long sleeves, sun glasses (UVA+UVB protection) and wide brim hats (4-inch brim around the entire circumference of the hat) are also recommended for sun protection.  - Call for new or  changing lesions.  LENTIGINES, SEBORRHEIC KERATOSES, HEMANGIOMAS - Benign normal skin lesions - Benign-appearing - Call for any changes  MELANOCYTIC NEVI - Tan-brown and/or pink-flesh-colored symmetric macules and papules - Benign appearing on exam today - Observation - Call clinic for new or changing moles - Recommend daily use of broad spectrum spf 30+ sunscreen to sun-exposed areas.   HISTORY OF BASAL CELL CARCINOMA OF THE SKIN. Superficial at left upper back, [facility] 11/30/22  - No evidence of recurrence today - Recommend regular full body skin exams - Recommend daily broad spectrum sunscreen SPF 30+ to sun-exposed areas, reapply every 2 hours as needed.  - Call if any new or changing lesions are noted between office visits   HISTORY OF DYSPLASTIC NEVUS. Left post distal deltoid  No evidence of recurrence today Recommend regular full body skin exams Recommend daily broad spectrum sunscreen SPF 30+ to sun-exposed areas, reapply every 2 hours as needed.  Call if any new or changing lesions are noted between office visits    HISTORY OF SQUAMOUS CELL CARCINOMA OF THE SKIN - multiple - BX proven at right mid helix, patient was referred for Mohs with Dr. Corey but was unable to schedule due to transportation. Patient advises that he used a topical from his daughter x 14 days and spot got red and irritated. Will monitor for recurrence.  - No evidence of recurrence today - No lymphadenopathy - Recommend regular full body skin exams - Recommend daily broad spectrum sunscreen SPF 30+ to sun-exposed areas, reapply every 2 hours as needed.  - Call if any new or changing lesions are noted between office visits  MULTIPLE BENIGN NEVI   LENTIGINES   ACTINIC ELASTOSIS   SEBORRHEIC KERATOSES   CHERRY ANGIOMA   AK (ACTINIC KERATOSIS) (5) R temple x 1, L infraorbital cheek x 2,  L antihelix x 1, L nasal dorsum x 1 (5) Actinic keratoses are precancerous spots that appear secondary to  cumulative UV radiation exposure/sun exposure over time. They are chronic with expected duration over 1 year. A portion of actinic keratoses will progress to squamous cell carcinoma of the skin. It is not possible to reliably predict which spots will progress to skin cancer and so treatment is recommended to prevent development of skin cancer.  Recommend daily broad spectrum sunscreen SPF 30+ to sun-exposed areas, reapply every 2 hours as needed.  Recommend staying in the shade or wearing long sleeves, sun glasses (UVA+UVB protection) and wide brim hats (4-inch brim around the entire circumference of the hat). Call for new or changing lesions. Destruction of lesion - R temple x 1, L infraorbital cheek x 2,  L antihelix x 1, L nasal dorsum x 1 (5) Complexity: simple   Destruction method: cryotherapy   Informed consent: discussed and consent obtained   Timeout:  patient name, date of birth, surgical site, and procedure verified Lesion destroyed using liquid nitrogen: Yes   Region frozen until ice ball extended beyond lesion: Yes   Cryo cycles: 1 or 2. Outcome: patient tolerated procedure well with no complications   Post-procedure details: wound care instructions given    INFLAMED SEBORRHEIC KERATOSIS L medial clavicle x 1 Symptomatic, irritating, patient would like treated.  Benign-appearing.  Call clinic for new or changing lesions.   Destruction of lesion - L medial clavicle x 1 Complexity: simple   Destruction method: cryotherapy   Informed consent: discussed and consent obtained   Timeout:  patient name, date of birth, surgical site, and procedure verified Lesion destroyed using liquid nitrogen: Yes   Region frozen until ice ball extended beyond lesion: Yes   Cryo cycles: 1 or 2. Outcome: patient tolerated procedure well with no complications   Post-procedure details: wound care instructions given    NEOPLASM OF UNCERTAIN BEHAVIOR OF SKIN right superior clavicle Epidermal /  dermal shaving  Lesion diameter (cm):  0.8 Informed consent: discussed and consent obtained   Timeout: patient name, date of birth, surgical site, and procedure verified   Procedure prep:  Patient was prepped and draped in usual sterile fashion Prep type:  Isopropyl alcohol Anesthesia: the lesion was anesthetized in a standard fashion   Anesthetic:  1% lidocaine  w/ epinephrine 1-100,000 buffered w/ 8.4% NaHCO3 Instrument used: DermaBlade   Hemostasis achieved with: pressure and aluminum chloride   Outcome: patient tolerated procedure well   Post-procedure details: wound care instructions given    Specimen 1 - Surgical pathology Differential Diagnosis: Lentigo vs Lentigo Maligna  Check Margins: No Return in about 1 year (around 06/03/2025) for TBSE, with Dr. Claudene, HxSCC, HxBCC, HxAK.  LILLETTE Lonell Drones, RMA, am acting as scribe for Boneta Claudene, MD .   Documentation: I have reviewed the above documentation for accuracy and completeness, and I agree with the above.  Boneta Claudene, MD

## 2024-06-04 LAB — SURGICAL PATHOLOGY

## 2024-06-05 ENCOUNTER — Ambulatory Visit: Payer: Self-pay | Admitting: Dermatology

## 2024-06-06 NOTE — Telephone Encounter (Signed)
 Discussed pathology results and treatment plan. Patient voiced understanding. Appointment scheduled.

## 2024-06-06 NOTE — Telephone Encounter (Signed)
-----   Message from [place] sent at 06/05/2024  9:04 PM EST ----- Diagnosis: right superior clavicle :       DYSPLASTIC NEVUS WITH MODERATE TO SEVERE ATYPIA, CLOSE TO MARGIN, SEE       DESCRIPTION   Plan: please call with diagnosis and schedule shave removal  The biopsy from your right upper chest shows an atypical mole (dysplastic nevus). It is partly severely abnormal which can be difficult to distinguish from melanoma skin cancer, so we should ensure  it has been fully removed. I recommend that you return for an appointment where we remove another thin layer of skin where the mole was to check for any remaining abnormal mole. ----- Message ----- From: Interface, Lab In Three Zero One Sent: 06/04/2024   6:00 PM EST To: Boneta Sharps, MD

## 2024-06-20 ENCOUNTER — Ambulatory Visit: Admitting: Dermatology

## 2024-07-02 ENCOUNTER — Ambulatory Visit: Admitting: Internal Medicine

## 2024-07-04 ENCOUNTER — Encounter: Payer: Self-pay | Admitting: Dermatology

## 2024-07-04 ENCOUNTER — Ambulatory Visit: Admitting: Dermatology

## 2024-07-04 DIAGNOSIS — D2261 Melanocytic nevi of right upper limb, including shoulder: Secondary | ICD-10-CM | POA: Diagnosis not present

## 2024-07-04 DIAGNOSIS — D492 Neoplasm of unspecified behavior of bone, soft tissue, and skin: Secondary | ICD-10-CM | POA: Diagnosis not present

## 2024-07-04 DIAGNOSIS — L820 Inflamed seborrheic keratosis: Secondary | ICD-10-CM

## 2024-07-04 NOTE — Patient Instructions (Signed)

## 2024-07-04 NOTE — Progress Notes (Signed)
° °  Follow-Up Visit   Subjective  Paul Gaines is a 89 y.o. male who presents for the following: re-shave of dysplastic nevus with severe atypia. Right superior clavicle. Bx: 06/03/2024.  ISK at upper chest did not completely resolve after LN2 treatment 06/03/2024.   The following portions of the chart were reviewed this encounter and updated as appropriate: medications, allergies, medical history  Review of Systems:  No other skin or systemic complaints except as noted in HPI or Assessment and Plan.  Objective  Well appearing patient in no apparent distress; mood and affect are within normal limits.  A focused examination was performed of the following areas: Chest  Relevant physical exam findings are noted in the Assessment and Plan.  Right Superior Clavicle Pink healing surgical wound Left Upper Chest x1 Erythematous keratotic or waxy stuck-on papule or plaque.  Assessment & Plan   NEOPLASM OF SKIN Right Superior Clavicle - Epidermal / dermal shaving  Lesion diameter (cm):  1.1 Informed consent: discussed and consent obtained   Timeout: patient name, date of birth, surgical site, and procedure verified   Procedure prep:  Patient was prepped and draped in usual sterile fashion Prep type:  Isopropyl alcohol Anesthesia: the lesion was anesthetized in a standard fashion   Anesthetic:  1% lidocaine  w/ epinephrine 1-100,000 buffered w/ 8.4% NaHCO3 Instrument used: DermaBlade   Hemostasis achieved with: pressure and aluminum chloride   Outcome: patient tolerated procedure well   Post-procedure details: wound care instructions given    Specimen 1 - Surgical pathology Differential Diagnosis: R/O residual dysplastic nevus with severe atypia  Check Margins: Yes  Previous pathology: [id number] INFLAMED SEBORRHEIC KERATOSIS Left Upper Chest x1 Symptomatic, irritating, patient would like treated. - Destruction of lesion - Left Upper Chest x1 Complexity: simple    Destruction method: cryotherapy   Informed consent: discussed and consent obtained   Timeout:  patient name, date of birth, surgical site, and procedure verified Lesion destroyed using liquid nitrogen: Yes   Region frozen until ice ball extended beyond lesion: Yes   Cryo cycles: 1 or 2. Outcome: patient tolerated procedure well with no complications   Post-procedure details: wound care instructions given   Additional details:  Prior to procedure, discussed risks of blister formation, small wound, skin dyspigmentation, or rare scar following cryotherapy. Recommend Vaseline ointment to treated areas while healing.     Return for TBSE As Scheduled.  I, Jill Parcell, CMA, am acting as scribe for Boneta Sharps, MD.   Documentation: I have reviewed the above documentation for accuracy and completeness, and I agree with the above.  Boneta Sharps, MD

## 2024-07-05 LAB — SURGICAL PATHOLOGY

## 2024-07-06 ENCOUNTER — Ambulatory Visit: Payer: Self-pay | Admitting: Dermatology

## 2024-07-08 ENCOUNTER — Encounter: Payer: Self-pay | Admitting: Dermatology

## 2024-07-08 NOTE — Telephone Encounter (Signed)
 Advised pt of bx results/sh ?

## 2024-07-08 NOTE — Telephone Encounter (Signed)
-----   Message from Boneta Sharps, MD sent at 07/06/2024 12:25 PM EST ----- Diagnosis right superior clavicle :       NO RESIDUAL DYSPLASTIC NEVUS, MARGINS FREE   Please call to share that excision was clear of abnormal mole. Thank you.

## 2024-07-09 ENCOUNTER — Other Ambulatory Visit

## 2024-07-09 ENCOUNTER — Ambulatory Visit (INDEPENDENT_AMBULATORY_CARE_PROVIDER_SITE_OTHER): Admitting: Internal Medicine

## 2024-07-09 ENCOUNTER — Encounter: Payer: Self-pay | Admitting: Internal Medicine

## 2024-07-09 VITALS — BP 102/68 | HR 56 | Temp 97.9°F | Ht 70.0 in | Wt 176.6 lb

## 2024-07-09 DIAGNOSIS — N528 Other male erectile dysfunction: Secondary | ICD-10-CM

## 2024-07-09 DIAGNOSIS — M545 Low back pain, unspecified: Secondary | ICD-10-CM | POA: Diagnosis not present

## 2024-07-09 DIAGNOSIS — Z0001 Encounter for general adult medical examination with abnormal findings: Secondary | ICD-10-CM

## 2024-07-09 DIAGNOSIS — E1165 Type 2 diabetes mellitus with hyperglycemia: Secondary | ICD-10-CM | POA: Diagnosis not present

## 2024-07-09 DIAGNOSIS — E119 Type 2 diabetes mellitus without complications: Secondary | ICD-10-CM

## 2024-07-09 DIAGNOSIS — Z0130 Encounter for examination of blood pressure without abnormal findings: Secondary | ICD-10-CM

## 2024-07-09 DIAGNOSIS — S239XXA Sprain of unspecified parts of thorax, initial encounter: Secondary | ICD-10-CM

## 2024-07-09 LAB — CBC WITH DIFF/PLATELET
Basophils Absolute: 0.1 x10E3/uL (ref 0.0–0.2)
Basos: 1 %
EOS (ABSOLUTE): 0.3 x10E3/uL (ref 0.0–0.4)
Eos: 5 %
Hematocrit: 35.2 % — ABNORMAL LOW (ref 37.5–51.0)
Hemoglobin: 11.4 g/dL — ABNORMAL LOW (ref 13.0–17.7)
Immature Grans (Abs): 0 x10E3/uL (ref 0.0–0.1)
Immature Granulocytes: 0 %
Lymphocytes Absolute: 2.2 x10E3/uL (ref 0.7–3.1)
Lymphs: 30 %
MCH: 31.7 pg (ref 26.6–33.0)
MCHC: 32.4 g/dL (ref 31.5–35.7)
MCV: 98 fL — ABNORMAL HIGH (ref 79–97)
Monocytes Absolute: 0.9 x10E3/uL (ref 0.1–0.9)
Monocytes: 12 %
Neutrophils Absolute: 4 x10E3/uL (ref 1.4–7.0)
Neutrophils: 52 %
Platelets: 184 x10E3/uL (ref 150–450)
RBC: 3.6 x10E6/uL — ABNORMAL LOW (ref 4.14–5.80)
RDW: 13.4 % (ref 11.6–15.4)
WBC: 7.5 x10E3/uL (ref 3.4–10.8)

## 2024-07-09 LAB — POCT CBG (FASTING - GLUCOSE)-MANUAL ENTRY: Glucose Fasting, POC: 113 mg/dL — AB (ref 70–99)

## 2024-07-09 MED ORDER — TIZANIDINE HCL 2 MG PO TABS: 2.0000 mg | ORAL_TABLET | Freq: Three times a day (TID) | ORAL | 1 refills | Status: AC | PRN

## 2024-07-09 MED ORDER — SILDENAFIL CITRATE 100 MG PO TABS: 100.0000 mg | ORAL_TABLET | ORAL | 1 refills | Status: AC | PRN

## 2024-07-09 MED ORDER — NAPROXEN 500 MG PO TABS: 500.0000 mg | ORAL_TABLET | Freq: Two times a day (BID) | ORAL | 0 refills | Status: AC

## 2024-07-09 NOTE — Progress Notes (Signed)
 "  Established Patient Office Visit  Subjective:  Patient ID: Paul Gaines, male    DOB: [birth year]  Age: 89 y.o. MRN: [id number]  Chief Complaint  Patient presents with   Annual Exam    3 month AWV lab results     No new complaints, here for AWV refer to quality metrics and scanned documents.  Failed to have previsit labs done. Sprained his upper back a few days ago.     No other concerns at this time.   Past Medical History:  Diagnosis Date   Arthritis    Asthma    BCC (basal cell carcinoma) 09/15/2022   superficial at left upper back, EDC 11/30/22   Cancer (HCC)    hx of skin cancer on nose   Chronic kidney disease    frequency   Coronary artery disease    stent - 2001    Dysplastic nevus 08/13/2014   left post distal deltoid   Dysplastic nevus 06/03/2024   Right superior clavicle. Moderate to severe atypia, close to margin. Re-shave 07/04/2024   Hypertension    Legally blind    Myocardial infarction (HCC)    Pneumonia    hx of several times per pt    Shortness of breath    with exertion    Squamous cell carcinoma in situ (SCCIS) 05/24/2022   left temple, Mohs 07/20/2022   Squamous cell carcinoma of skin 07/01/2010   left infraorbital (in situ)   Squamous cell carcinoma of skin 04/09/2024   Right mid helix. WD SCC. Mohs pending with Dr. Corey.    Past Surgical History:  Procedure Laterality Date   CORONARY ANGIOPLASTY     1988, stents    EYE SURGERY     right eye surgery , macular transrelocation    HERNIA REPAIR     KNEE ARTHROSCOPY     left    TONSILLECTOMY     TOTAL KNEE ARTHROPLASTY  03/26/2012   Procedure: TOTAL KNEE ARTHROPLASTY;  Surgeon: Dempsey LULLA Moan, MD;  Location: [facility];  Service: Orthopedics;  Laterality: Left;    Social History   Socioeconomic History   Marital status: Married    Spouse name: Not on file   Number of children: Not on file   Years of education: Not on file   Highest education level: Not on file  Occupational History    Not on file  Tobacco Use   Smoking status: Former    Current packs/day: 0.00    Types: Cigarettes    Quit date: 07/18/1976    Years since quitting: 48.0   Smokeless tobacco: Never  Substance and Sexual Activity   Alcohol use: Never    Comment: infrequent per pt    Drug use: No   Sexual activity: Not on file  Other Topics Concern   Not on file  Social History Narrative   Not on file   Social Drivers of Health   Tobacco Use: Medium Risk (07/04/2024)   Patient History    Smoking Tobacco Use: Former    Smokeless Tobacco Use: Never    Passive Exposure: Not on Actuary Strain: Not on file  Food Insecurity: Not on file  Transportation Needs: Not on file  Physical Activity: Not on file  Stress: Not on file  Social Connections: Not on file  Intimate Partner Violence: Not on file  Depression (PHQ2-9): Low Risk (06/28/2023)   Depression (PHQ2-9)    PHQ-2 Score: 2  Alcohol Screen: Not on  file  Housing: Unknown (12/18/2023)   Received from [facility]   Epic    Unable to Pay for Housing in the Last Year: Not on file    Number of Times Moved in the Last Year: Not on file    At any time in the past 12 months, were you homeless or living in a shelter (including now)?: No  Utilities: Not on file  Health Literacy: Not on file    No family history on file.  Allergies[1]  Show/hide medication list[2]  Review of Systems  Constitutional:  Negative for malaise/fatigue.  HENT: Negative.    Eyes: Negative.   Respiratory:  Negative for cough.   Cardiovascular:  Negative for chest pain and palpitations.  Gastrointestinal:  Positive for constipation.  Genitourinary:  Positive for frequency.  Musculoskeletal:  Positive for back pain (mid back).  Neurological:  Negative for dizziness.  Psychiatric/Behavioral: Negative.    All other systems reviewed and are negative.      Objective:   BP 102/68   Pulse (!) 56   Temp 97.9 F (36.6 C)   Ht 5'  10 (1.778 m)   Wt 176 lb 9.6 oz (80.1 kg)   SpO2 95%   BMI 25.34 kg/m   Vitals:   07/09/24 1452  BP: 102/68  Pulse: (!) 56  Temp: 97.9 F (36.6 C)  Height: 5' 10 (1.778 m)  Weight: 176 lb 9.6 oz (80.1 kg)  SpO2: 95%  BMI (Calculated): 25.34    Physical Exam Vitals reviewed.  Constitutional:      Appearance: Normal appearance.  HENT:     Head: Normocephalic.     Left Ear: There is no impacted cerumen.     Nose: Nose normal.     Mouth/Throat:     Mouth: Mucous membranes are moist.     Pharynx: No posterior oropharyngeal erythema.  Eyes:     Comments: Poor visual acuity bilaterally  Cardiovascular:     Rate and Rhythm: Regular rhythm.     Chest Wall: PMI is not displaced.     Pulses: Normal pulses.     Heart sounds: Normal heart sounds. No murmur heard. Pulmonary:     Effort: Pulmonary effort is normal.     Breath sounds: Normal air entry. No rhonchi or rales.  Abdominal:     General: Abdomen is flat. Bowel sounds are normal. There is no distension.     Palpations: Abdomen is soft. There is no hepatomegaly, splenomegaly or mass.     Tenderness: There is no abdominal tenderness.  Musculoskeletal:        General: Normal range of motion.     Cervical back: Normal range of motion and neck supple.     Thoracic back: Spasms and tenderness (Mid back) present.     Right lower leg: No edema.     Left lower leg: No edema.  Skin:    General: Skin is warm and dry.     Comments: Wart like lesion on right groin.  Neurological:     General: No focal deficit present.     Mental Status: He is alert and oriented to person, place, and time.     Cranial Nerves: No cranial nerve deficit.     Motor: No weakness.  Psychiatric:        Mood and Affect: Mood normal.        Behavior: Behavior normal.      Results for orders placed or performed in visit on  07/09/24  POCT CBG (Fasting - Glucose)  Result Value Ref Range   Glucose Fasting, POC 113 (A) 70 - 99 mg/dL         Assessment & Plan:  Paul Gaines was seen today for annual exam.  Diabetes mellitus without complication (HCC) -     POCT CBG (Fasting - Glucose)  Other male erectile dysfunction -     Sildenafil  Citrate; Take 1 tablet (100 mg total) by mouth as needed for erectile dysfunction.  Dispense: 10 tablet; Refill: 1  Thoracic back sprain, initial encounter -     tiZANidine  HCl; Take 1 tablet (2 mg total) by mouth every 8 (eight) hours as needed for up to 20 days for muscle spasms.  Dispense: 30 tablet; Refill: 1 -     Naproxen ; Take 1 tablet (500 mg total) by mouth 2 (two) times daily with a meal.  Dispense: 60 tablet; Refill: 0    Problem List Items Addressed This Visit   None Visit Diagnoses       Diabetes mellitus without complication (HCC)    -  Primary   Relevant Orders   POCT CBG (Fasting - Glucose) (Completed)     Other male erectile dysfunction       Relevant Medications   sildenafil  (VIAGRA ) 100 MG tablet     Thoracic back sprain, initial encounter       Relevant Medications   tiZANidine  (ZANAFLEX ) 2 MG tablet   naproxen  (NAPROSYN ) 500 MG tablet       Return in 3 months (on 10/07/2024) for fu with labs prior.   Total time spent: 30 minutes. This time includes review of previous notes and results and patient face to face interaction during today'Paul Gaines visit.    Sherrill Cinderella Perry, MD  07/09/2024   This document may have been prepared by [facility] Voice Recognition software and as such may include unintentional dictation errors.     [1]  Allergies Allergen Reactions   Celecoxib     Reaction=heart attack per patient   Lisinopril Other (See Comments)  [2]  Outpatient Medications Prior to Visit  Medication Sig   Accu-Chek Softclix Lancets lancets TEST TWO TIMES DAILY   amLODipine  (NORVASC ) 10 MG tablet Take 1 tablet (10 mg total) by mouth daily.   amLODipine  (NORVASC ) 10 MG tablet Take 1 tablet (10 mg total) by mouth daily.   aspirin EC 81 MG tablet Take 81 mg by mouth  daily.   carvedilol  (COREG ) 6.25 MG tablet Take 1 tablet (6.25 mg total) by mouth 2 (two) times daily.   dorzolamide-timolol  (COSOPT) 2-0.5 % ophthalmic solution Place 1 drop into both eyes 2 (two) times daily.   losartan  (COZAAR ) 100 MG tablet Take 1 tablet (100 mg total) by mouth daily.   Multiple Vitamin (MULTIVITAMIN WITH MINERALS) TABS tablet Take 1 tablet by mouth daily.   pravastatin (PRAVACHOL) 40 MG tablet TAKE 1 TABLET EVERY DAY   spironolactone  (ALDACTONE ) 25 MG tablet Take 0.5 tablets (12.5 mg total) by mouth daily.   tamsulosin  (FLOMAX ) 0.4 MG CAPS capsule Take 1 capsule (0.4 mg total) by mouth every morning.   timolol  (BETIMOL ) 0.5 % ophthalmic solution Place 1 drop into both eyes every morning.   fluticasone (FLONASE) 50 MCG/ACT nasal spray USE 1 SPRAY IN EACH NOSTRIL IN THE MORNING AS DIRECTED (Patient not taking: Reported on 07/09/2024)   [DISCONTINUED] sildenafil  (VIAGRA ) 100 MG tablet Take 1 tablet (100 mg total) by mouth as needed for erectile dysfunction. (Patient not taking: Reported on  07/09/2024)   No facility-administered medications prior to visit.   "

## 2024-07-10 LAB — LIPID PANEL
Chol/HDL Ratio: 1.9 ratio (ref 0.0–5.0)
Cholesterol, Total: 111 mg/dL (ref 100–199)
HDL: 59 mg/dL
LDL Chol Calc (NIH): 36 mg/dL (ref 0–99)
Triglycerides: 77 mg/dL (ref 0–149)
VLDL Cholesterol Cal: 16 mg/dL (ref 5–40)

## 2024-07-10 LAB — COMPREHENSIVE METABOLIC PANEL WITH GFR
ALT: 7 IU/L (ref 0–44)
AST: 8 IU/L (ref 0–40)
Albumin: 3.9 g/dL (ref 3.7–4.7)
Alkaline Phosphatase: 54 IU/L (ref 48–129)
BUN/Creatinine Ratio: 21 (ref 10–24)
BUN: 31 mg/dL — ABNORMAL HIGH (ref 8–27)
Bilirubin Total: 0.3 mg/dL (ref 0.0–1.2)
CO2: 22 mmol/L (ref 20–29)
Calcium: 9 mg/dL (ref 8.6–10.2)
Chloride: 107 mmol/L — ABNORMAL HIGH (ref 96–106)
Creatinine, Ser: 1.45 mg/dL — ABNORMAL HIGH (ref 0.76–1.27)
Globulin, Total: 2 g/dL (ref 1.5–4.5)
Glucose: 95 mg/dL (ref 70–99)
Potassium: 4.7 mmol/L (ref 3.5–5.2)
Sodium: 142 mmol/L (ref 134–144)
Total Protein: 5.9 g/dL — ABNORMAL LOW (ref 6.0–8.5)
eGFR: 46 mL/min/1.73 — ABNORMAL LOW

## 2024-07-12 ENCOUNTER — Ambulatory Visit: Payer: Self-pay | Admitting: Internal Medicine

## 2024-07-12 DIAGNOSIS — D649 Anemia, unspecified: Secondary | ICD-10-CM

## 2024-07-12 MED ORDER — POLYSACCHARIDE IRON COMPLEX 150 MG PO CAPS: 150.0000 mg | ORAL_CAPSULE | Freq: Every day | ORAL | 0 refills | Status: AC

## 2024-07-12 NOTE — Progress Notes (Signed)
Pt informed

## 2024-07-17 ENCOUNTER — Other Ambulatory Visit: Payer: Self-pay | Admitting: Cardiovascular Disease

## 2024-07-17 DIAGNOSIS — I2510 Atherosclerotic heart disease of native coronary artery without angina pectoris: Secondary | ICD-10-CM

## 2024-07-17 DIAGNOSIS — E119 Type 2 diabetes mellitus without complications: Secondary | ICD-10-CM

## 2024-07-17 DIAGNOSIS — E782 Mixed hyperlipidemia: Secondary | ICD-10-CM

## 2024-07-17 DIAGNOSIS — N179 Acute kidney failure, unspecified: Secondary | ICD-10-CM

## 2024-07-17 DIAGNOSIS — I10 Essential (primary) hypertension: Secondary | ICD-10-CM

## 2024-07-17 DIAGNOSIS — R0602 Shortness of breath: Secondary | ICD-10-CM

## 2024-07-17 DIAGNOSIS — R001 Bradycardia, unspecified: Secondary | ICD-10-CM

## 2024-07-30 ENCOUNTER — Ambulatory Visit: Admitting: Cardiovascular Disease

## 2024-07-30 ENCOUNTER — Encounter: Payer: Self-pay | Admitting: Cardiovascular Disease

## 2024-07-30 VITALS — BP 110/68 | HR 51 | Ht 70.0 in | Wt 176.0 lb

## 2024-07-30 DIAGNOSIS — I2510 Atherosclerotic heart disease of native coronary artery without angina pectoris: Secondary | ICD-10-CM | POA: Diagnosis not present

## 2024-07-30 DIAGNOSIS — R0602 Shortness of breath: Secondary | ICD-10-CM | POA: Diagnosis not present

## 2024-07-30 DIAGNOSIS — I10 Essential (primary) hypertension: Secondary | ICD-10-CM

## 2024-07-30 DIAGNOSIS — N179 Acute kidney failure, unspecified: Secondary | ICD-10-CM | POA: Diagnosis not present

## 2024-07-30 NOTE — Progress Notes (Signed)
 "     Cardiology Office Note   Date:  07/30/2024   ID:  Paul Gaines, Paul Gaines 12-Apr-1933, MRN [id number]  PCP:  Paul GORMAN Dine, MD  Cardiologist:  Denyse Bathe, MD      History of Present Illness: Paul Gaines is a 89 y.o. male who presents for  Chief Complaint  Patient presents with   Follow-up    3 month follow up    Gets SOB all the time as has inus issues.      Past Medical History:  Diagnosis Date   Arthritis    Asthma    BCC (basal cell carcinoma) 09/15/2022   superficial at left upper back, EDC 11/30/22   Cancer (HCC)    hx of skin cancer on nose   Chronic kidney disease    frequency   Coronary artery disease    stent - 2001    Dysplastic nevus 08/13/2014   left post distal deltoid   Dysplastic nevus 06/03/2024   Right superior clavicle. Moderate to severe atypia, close to margin. Re-shave 07/04/2024   Hypertension    Legally blind    Myocardial infarction (HCC)    Pneumonia    hx of several times per pt    Shortness of breath    with exertion    Squamous cell carcinoma in situ (SCCIS) 05/24/2022   left temple, Mohs 07/20/2022   Squamous cell carcinoma of skin 07/01/2010   left infraorbital (in situ)   Squamous cell carcinoma of skin 04/09/2024   Right mid helix. WD SCC. Mohs pending with Dr. Corey.     Past Surgical History:  Procedure Laterality Date   CORONARY ANGIOPLASTY     1988, stents    EYE SURGERY     right eye surgery , macular transrelocation    HERNIA REPAIR     KNEE ARTHROSCOPY     left    TONSILLECTOMY     TOTAL KNEE ARTHROPLASTY  03/26/2012   Procedure: TOTAL KNEE ARTHROPLASTY;  Surgeon: Paul LULLA Moan, MD;  Location: [facility];  Service: Orthopedics;  Laterality: Left;     Current Outpatient Medications  Medication Sig Dispense Refill   Accu-Chek Softclix Lancets lancets TEST TWO TIMES DAILY 200 each 3   amLODipine  (NORVASC ) 10 MG tablet Take 1 tablet (10 mg total) by mouth daily. 30 tablet 11   amLODipine  (NORVASC ) 10 MG  tablet Take 1 tablet (10 mg total) by mouth daily. 30 tablet 11   aspirin EC 81 MG tablet Take 81 mg by mouth daily.     carvedilol  (COREG ) 6.25 MG tablet Take 1 tablet (6.25 mg total) by mouth 2 (two) times daily. 60 tablet 11   dorzolamide-timolol  (COSOPT) 2-0.5 % ophthalmic solution Place 1 drop into both eyes 2 (two) times daily.     iron  polysaccharides (NIFEREX) 150 MG capsule Take 1 capsule (150 mg total) by mouth daily. 90 capsule 0   losartan  (COZAAR ) 100 MG tablet TAKE 1 TABLET EVERY DAY 90 tablet 3   Multiple Vitamin (MULTIVITAMIN WITH MINERALS) TABS tablet Take 1 tablet by mouth daily.     naproxen  (NAPROSYN ) 500 MG tablet Take 1 tablet (500 mg total) by mouth 2 (two) times daily with a meal. 60 tablet 0   pravastatin (PRAVACHOL) 40 MG tablet TAKE 1 TABLET EVERY DAY 90 tablet 3   sildenafil  (VIAGRA ) 100 MG tablet Take 1 tablet (100 mg total) by mouth as needed for erectile dysfunction. 10 tablet 1   spironolactone  (ALDACTONE )  25 MG tablet Take 0.5 tablets (12.5 mg total) by mouth daily. 30 tablet 11   tamsulosin  (FLOMAX ) 0.4 MG CAPS capsule Take 1 capsule (0.4 mg total) by mouth every morning. 90 capsule 3   timolol  (BETIMOL ) 0.5 % ophthalmic solution Place 1 drop into both eyes every morning.     No current facility-administered medications for this visit.    Allergies:   Celecoxib and Lisinopril    Social History:   reports that he quit smoking about 48 years ago. His smoking use included cigarettes. He has never used smokeless tobacco. He reports that he does not drink alcohol and does not use drugs.   Family History:  family history is not on file.    ROS:     Review of Systems  Constitutional: Negative.   HENT: Negative.    Eyes: Negative.   Respiratory: Negative.    Gastrointestinal: Negative.   Genitourinary: Negative.   Musculoskeletal: Negative.   Skin: Negative.   Neurological: Negative.   Endo/Heme/Allergies: Negative.   Psychiatric/Behavioral: Negative.     All other systems reviewed and are negative.     All other systems are reviewed and negative.    PHYSICAL EXAM: VS:  BP 110/68   Pulse (!) 51   Ht 5' 10 (1.778 m)   Wt 176 lb (79.8 kg)   SpO2 96%   BMI 25.25 kg/m  , BMI Body mass index is 25.25 kg/m. Last weight:  Wt Readings from Last 3 Encounters:  07/30/24 176 lb (79.8 kg)  07/09/24 176 lb 9.6 oz (80.1 kg)  04/02/24 179 lb (81.2 kg)     Physical Exam Vitals reviewed.  Constitutional:      Appearance: Normal appearance. He is normal weight.  HENT:     Head: Normocephalic.     Nose: Nose normal.     Mouth/Throat:     Mouth: Mucous membranes are moist.  Eyes:     Pupils: Pupils are equal, round, and reactive to light.  Cardiovascular:     Rate and Rhythm: Normal rate and regular rhythm.     Pulses: Normal pulses.     Heart sounds: Normal heart sounds.  Pulmonary:     Effort: Pulmonary effort is normal.  Abdominal:     General: Abdomen is flat. Bowel sounds are normal.  Musculoskeletal:        General: Normal range of motion.     Cervical back: Normal range of motion.  Skin:    General: Skin is warm.  Neurological:     General: No focal deficit present.     Mental Status: He is alert.  Psychiatric:        Mood and Affect: Mood normal.       EKG:   Recent Labs: 07/09/2024: ALT 7; BUN 31; Creatinine, Ser 1.45; Hemoglobin 11.4; Platelets 184; Potassium 4.7; Sodium 142    Lipid Panel    Component Value Date/Time   CHOL 111 07/09/2024 0843   TRIG 77 07/09/2024 0843   HDL 59 07/09/2024 0843   CHOLHDL 1.9 07/09/2024 0843   LDLCALC 36 07/09/2024 0843      Other studies Reviewed: Additional studies/ records that were reviewed today include:  Review of the above records demonstrates:       No data to display            ASSESSMENT AND PLAN:    ICD-10-CM   1. Primary hypertension  I10     2. Coronary artery disease involving native  coronary artery of native heart without angina  pectoris  I25.10     3. SOB (shortness of breath)  R06.02     4. Acute kidney injury (nontraumatic)  N17.9    creat 1.45 unchanged       Problem List Items Addressed This Visit       Cardiovascular and Mediastinum   Primary hypertension - Primary   Other Visit Diagnoses       Coronary artery disease involving native coronary artery of native heart without angina pectoris         SOB (shortness of breath)         Acute kidney injury (nontraumatic)       creat 1.45 unchanged          Disposition:   No follow-ups on file.    Total time spent: 30 minutes  Signed,  Denyse Bathe, MD  07/30/2024 1:49 PM    [facility] "

## 2024-08-02 ENCOUNTER — Other Ambulatory Visit: Payer: Self-pay | Admitting: Cardiovascular Disease

## 2024-08-02 ENCOUNTER — Other Ambulatory Visit: Payer: Self-pay | Admitting: Internal Medicine

## 2024-08-15 ENCOUNTER — Other Ambulatory Visit: Payer: Self-pay | Admitting: Cardiovascular Disease

## 2024-08-15 DIAGNOSIS — R0602 Shortness of breath: Secondary | ICD-10-CM

## 2024-08-15 DIAGNOSIS — I10 Essential (primary) hypertension: Secondary | ICD-10-CM

## 2024-08-15 DIAGNOSIS — E782 Mixed hyperlipidemia: Secondary | ICD-10-CM

## 2024-08-15 DIAGNOSIS — R001 Bradycardia, unspecified: Secondary | ICD-10-CM

## 2024-08-15 DIAGNOSIS — I2510 Atherosclerotic heart disease of native coronary artery without angina pectoris: Secondary | ICD-10-CM

## 2024-10-08 ENCOUNTER — Ambulatory Visit: Admitting: Internal Medicine

## 2024-10-29 ENCOUNTER — Ambulatory Visit: Admitting: Cardiovascular Disease

## 2025-06-03 ENCOUNTER — Ambulatory Visit: Admitting: Dermatology
